# Patient Record
Sex: Female | Born: 1950 | Race: White | Hispanic: No | Smoking: Never smoker
Health system: Southern US, Community
[De-identification: ages and names within clinical notes are randomized; demographics above are authoritative.]

## PROBLEM LIST (undated history)

## (undated) DIAGNOSIS — E079 Disorder of thyroid, unspecified: Secondary | ICD-10-CM

## (undated) DIAGNOSIS — E785 Hyperlipidemia, unspecified: Secondary | ICD-10-CM

## (undated) DIAGNOSIS — I10 Essential (primary) hypertension: Secondary | ICD-10-CM

## (undated) HISTORY — PX: ABDOMINAL HYSTERECTOMY: SHX81

---

## 2019-07-08 ENCOUNTER — Emergency Department: Payer: Medicare Other

## 2019-07-08 ENCOUNTER — Encounter: Payer: Self-pay | Admitting: Emergency Medicine

## 2019-07-08 ENCOUNTER — Emergency Department
Admission: EM | Admit: 2019-07-08 | Discharge: 2019-07-08 | Disposition: A | Discharge status: Home / Self Care | Payer: Medicare Other | Attending: Emergency Medicine | Admitting: Emergency Medicine

## 2019-07-08 DIAGNOSIS — K5900 Constipation, unspecified: Secondary | ICD-10-CM | POA: Insufficient documentation

## 2019-07-08 DIAGNOSIS — R109 Unspecified abdominal pain: Secondary | ICD-10-CM | POA: Diagnosis present

## 2019-07-08 DIAGNOSIS — R1084 Generalized abdominal pain: Secondary | ICD-10-CM

## 2019-07-08 DIAGNOSIS — R55 Syncope and collapse: Secondary | ICD-10-CM | POA: Diagnosis not present

## 2019-07-08 DIAGNOSIS — I10 Essential (primary) hypertension: Secondary | ICD-10-CM | POA: Diagnosis not present

## 2019-07-08 DIAGNOSIS — R42 Dizziness and giddiness: Secondary | ICD-10-CM | POA: Insufficient documentation

## 2019-07-08 HISTORY — DX: Essential (primary) hypertension: I10

## 2019-07-08 HISTORY — DX: Disorder of thyroid, unspecified: E07.9

## 2019-07-08 HISTORY — DX: Hyperlipidemia, unspecified: E78.5

## 2019-07-08 LAB — CBC WITH DIFFERENTIAL/PLATELET
Abs Immature Granulocytes: 0.02 10*3/uL (ref 0.00–0.07)
Basophils Absolute: 0 10*3/uL (ref 0.0–0.1)
Basophils Relative: 1 %
Eosinophils Absolute: 0.1 10*3/uL (ref 0.0–0.5)
Eosinophils Relative: 2 %
HCT: 38.4 % (ref 36.0–46.0)
Hemoglobin: 12.4 g/dL (ref 12.0–15.0)
Immature Granulocytes: 0 %
Lymphocytes Relative: 19 %
Lymphs Abs: 1.1 10*3/uL (ref 0.7–4.0)
MCH: 28.9 pg (ref 26.0–34.0)
MCHC: 32.3 g/dL (ref 30.0–36.0)
MCV: 89.5 fL (ref 80.0–100.0)
Monocytes Absolute: 0.5 10*3/uL (ref 0.1–1.0)
Monocytes Relative: 8 %
Neutro Abs: 4 10*3/uL (ref 1.7–7.7)
Neutrophils Relative %: 70 %
Platelets: 173 10*3/uL (ref 150–400)
RBC: 4.29 MIL/uL (ref 3.87–5.11)
RDW: 14.2 % (ref 11.5–15.5)
WBC: 5.7 10*3/uL (ref 4.0–10.5)
nRBC: 0 % (ref 0.0–0.2)

## 2019-07-08 LAB — COMPREHENSIVE METABOLIC PANEL
ALT: 18 U/L (ref 0–44)
AST: 27 U/L (ref 15–41)
Albumin: 3.6 g/dL (ref 3.5–5.0)
Alkaline Phosphatase: 45 U/L (ref 38–126)
Anion gap: 8 (ref 5–15)
BUN: 27 mg/dL — ABNORMAL HIGH (ref 8–23)
CO2: 26 mmol/L (ref 22–32)
Calcium: 8.8 mg/dL — ABNORMAL LOW (ref 8.9–10.3)
Chloride: 106 mmol/L (ref 98–111)
Creatinine, Ser: 1.21 mg/dL — ABNORMAL HIGH (ref 0.44–1.00)
GFR calc Af Amer: 52 mL/min — ABNORMAL LOW (ref >60)
GFR calc non Af Amer: 45 mL/min — ABNORMAL LOW (ref >60)
Glucose, Bld: 104 mg/dL — ABNORMAL HIGH (ref 70–99)
Potassium: 4.9 mmol/L (ref 3.5–5.1)
Sodium: 140 mmol/L (ref 135–145)
Total Bilirubin: 0.5 mg/dL (ref 0.3–1.2)
Total Protein: 6.4 g/dL — ABNORMAL LOW (ref 6.5–8.1)

## 2019-07-08 LAB — URINALYSIS, COMPLETE (UACMP) WITH MICROSCOPIC
Bacteria, UA: NONE SEEN
Bilirubin Urine: NEGATIVE
Glucose, UA: NEGATIVE mg/dL
Hgb urine dipstick: NEGATIVE
Ketones, ur: 5 mg/dL — AB
Nitrite: NEGATIVE
Protein, ur: 30 mg/dL — AB
Specific Gravity, Urine: 1.018 (ref 1.005–1.030)
pH: 6 (ref 5.0–8.0)

## 2019-07-08 LAB — LIPASE, BLOOD: Lipase: 70 U/L — ABNORMAL HIGH (ref 11–51)

## 2019-07-08 LAB — TROPONIN I (HIGH SENSITIVITY)
Troponin I (High Sensitivity): 3 ng/L (ref <18)
Troponin I (High Sensitivity): 3 ng/L (ref <18)

## 2019-07-08 MED ORDER — IOHEXOL 300 MG/ML  SOLN
75.0000 mL | Freq: Once | INTRAMUSCULAR | Status: AC | PRN
  Administered 2019-07-08: 13:00:00 75 mL via INTRAVENOUS

## 2019-07-08 MED ORDER — DOCUSATE SODIUM 100 MG PO CAPS: 100.0000 mg | ORAL_CAPSULE | Freq: Two times a day (BID) | ORAL | 2 refills | Status: AC

## 2019-07-08 MED ORDER — LACTATED RINGERS IV BOLUS
1000.0000 mL | Freq: Once | INTRAVENOUS | Status: AC
  Administered 2019-07-08: 1000 mL via INTRAVENOUS

## 2019-07-08 NOTE — ED Notes (Signed)
Patient transported to CT 

## 2019-07-08 NOTE — ED Notes (Signed)
Pt unhooked to go to restroom 

## 2019-07-08 NOTE — ED Notes (Signed)
Pt reported that she was trying to have a BM when she got dizzy today. Pt was attempting to do the same now and felt the same symptoms again. Dizziness, pale, stomach pain, and nausea. Pt assisted back into bed and given nausea bag. MD notified.

## 2019-07-08 NOTE — ED Triage Notes (Signed)
Pt to ED by EMS after a near syncopal episode at home. Pt states prior to EMS arrival she felt dizzy and had abdominal pain with 1 emesis occurrence. Upon EMS arrival pt's BP 68/46.

## 2019-07-08 NOTE — ED Provider Notes (Signed)
[facility] Emergency Department Provider Note   ____________________________________________   First MD Initiated Contact with Patient 07/08/19 1004     (approximate)  I have reviewed the triage vital signs and the nursing notes.   HISTORY  Chief Complaint Lightheadedness   HPI Desiree Elliott is a 68 y.o. female with past medical history of hypertension and hypothyroidism presents to the ED complaining of abdominal pain and lightheadedness.  Patient reports that as she was getting out of the shower earlier today she had sudden onset of diffuse abdominal pain associated with lightheadedness.  She was able to move herself into a chair, but continued to feel lightheaded and like she might pass out.  EMS was called and by the time they had arrived her abdominal pain had improved.  She states she vomited once, but she denies any diarrhea or constipation.  She has recently been feeling well with no fevers, cough, chest pain, or shortness of breath.  Initial blood pressure was noted to be low by EMS, she was given a fluid bolus with improvement.        Past Medical History:  Diagnosis Date  . Hyperlipidemia   . Hypertension   . Thyroid disease     There are no active problems to display for this patient.   Past Surgical History:  Procedure Laterality Date  . ABDOMINAL HYSTERECTOMY      Prior to Admission medications   Medication Sig Start Date End Date Taking? Authorizing Provider  docusate sodium (COLACE) 100 MG capsule Take 1 capsule (100 mg total) by mouth 2 (two) times daily. 07/08/19 07/07/20  Chesley Noon, MD    Allergies Patient has no known allergies.  History reviewed. No pertinent family history.  Social History Social History   Tobacco Use  . Smoking status: Never Smoker  . Smokeless tobacco: Never Used  Substance Use Topics  . Alcohol use: Not Currently  . Drug use: Never    Review of Systems  Constitutional: No  fever/chills Eyes: No visual changes. ENT: No sore throat. Cardiovascular: Denies chest pain.  Positive for lightheadedness and near syncope. Respiratory: Denies shortness of breath. Gastrointestinal: Positive for abdominal pain.  No nausea, no vomiting.  No diarrhea.  No constipation. Genitourinary: Negative for dysuria. Musculoskeletal: Negative for back pain. Skin: Negative for rash. Neurological: Negative for headaches, focal weakness or numbness.  ____________________________________________   PHYSICAL EXAM:  VITAL SIGNS: ED Triage Vitals  Enc Vitals Group     BP      Pulse      Resp      Temp      Temp src      SpO2      Weight      Height      Head Circumference      Peak Flow      Pain Score      Pain Loc      Pain Edu?      Excl. in GC?     Constitutional: Alert and oriented. Eyes: Conjunctivae are normal. Head: Atraumatic. Nose: No congestion/rhinnorhea. Mouth/Throat: Mucous membranes are moist. Neck: Normal ROM Cardiovascular: Normal rate, regular rhythm. Grossly normal heart sounds. Respiratory: Normal respiratory effort.  No retractions. Lungs CTAB. Gastrointestinal: Soft and nontender. No distention. Genitourinary: deferred Musculoskeletal: No lower extremity tenderness nor edema. Neurologic:  Normal speech and language. No gross focal neurologic deficits are appreciated. Skin:  Skin is warm, dry and intact. No rash noted. Psychiatric: Mood and affect are  normal. Speech and behavior are normal.  ____________________________________________   LABS (all labs ordered are listed, but only abnormal results are displayed)  Labs Reviewed  URINALYSIS, COMPLETE (UACMP) WITH MICROSCOPIC - Abnormal; Notable for the following components:      Result Value   Color, Urine YELLOW (*)    APPearance CLEAR (*)    Ketones, ur 5 (*)    Protein, ur 30 (*)    Leukocytes,Ua SMALL (*)    Non Squamous Epithelial PRESENT (*)    All other components within normal  limits  COMPREHENSIVE METABOLIC PANEL - Abnormal; Notable for the following components:   Glucose, Bld 104 (*)    BUN 27 (*)    Creatinine, Ser 1.21 (*)    Calcium 8.8 (*)    Total Protein 6.4 (*)    GFR calc non Af Amer 45 (*)    GFR calc Af Amer 52 (*)    All other components within normal limits  LIPASE, BLOOD - Abnormal; Notable for the following components:   Lipase 70 (*)    All other components within normal limits  CBC WITH DIFFERENTIAL/PLATELET  TROPONIN I (HIGH SENSITIVITY)  TROPONIN I (HIGH SENSITIVITY)   ____________________________________________  EKG  ED ECG REPORT I, Blake Divine, the attending physician, personally viewed and interpreted this ECG.   Date: 07/08/2019  EKG Time: 10:10  Rate: 57  Rhythm: normal sinus rhythm  Axis: Normal  Intervals:right bundle branch block  ST&T Change: None   PROCEDURES  Procedure(s) performed (including Critical Care):  Procedures   ____________________________________________   INITIAL IMPRESSION / ASSESSMENT AND PLAN / ED COURSE       68 year old female with history of hypertension and hypothyroidism presents to the ED following episode of acute onset abdominal pain associated with lightheadedness and near syncope.  Abdominal pain has since resolved, but patient continues to report some mild lightheadedness.  Initial EKG from EMS reviewed and shows no evidence of arrhythmia or ischemia, will recheck here.  She currently has a benign and nonfocal abdominal exam, however given acute onset of pain with lightheadedness and hypotension, will check CT abdomen pelvis.  Low suspicion for infectious etiology given acute onset and lack of fever, will screen UA and chest x-ray.  No apparent infectious process, chest x-ray and UA negative.  CT abdomen/pelvis is negative for acute process, shows only constipation and evidence of chronic kidney disease.  Suspect creatinine of 1.21 is near patient's baseline and she feels much  better after IV fluid bolus.  Do not feel lipase of 70 is clinically significant given patient has no upper abdominal pain or tenderness.  2 sets of troponin are unremarkable.  Patient now admits that her first episode of near syncope occurred after straining for a bowel movement, had a similar episode here in the ED.  Given this, suspect vasovagal etiology of patient's near syncope.  Will have her follow-up with PCP and return to the ED for new or worsening symptoms.  We will treat constipation with stool softener, fiber supplement, and MiraLAX.  Patient agrees with plan.      ____________________________________________   FINAL CLINICAL IMPRESSION(S) / ED DIAGNOSES  Final diagnoses:  Near syncope  Generalized abdominal pain  Constipation, unspecified constipation type     ED Discharge Orders         Ordered    docusate sodium (COLACE) 100 MG capsule  2 times daily     07/08/19 1434  Note:  This document was prepared using Dragon voice recognition software and may include unintentional dictation errors.   Chesley NoonJessup, Ranika Mcniel, MD 07/08/19 [phone]

## 2019-07-08 NOTE — Discharge Instructions (Signed)
Please follow-up with your primary care doctor for further evaluation, including other test to look at your heart.  If you have recurrent episodes, please return to the ER for further evaluation.  In the meantime, you may take fiber supplements, Colace, and MiraLAX for your constipation.  Start by taking 1 capful of MiraLAX twice a day, which you may increase by 1 capful per day up until 4 capfuls twice a day.

## 2019-07-08 NOTE — ED Notes (Signed)
Pt wheeled out to lobby by Lou-ann.

## 2020-05-12 ENCOUNTER — Emergency Department
Admission: EM | Admit: 2020-05-12 | Discharge: 2020-05-12 | Disposition: A | Discharge status: Home / Self Care | Payer: Medicare Other | Attending: Emergency Medicine | Admitting: Emergency Medicine

## 2020-05-12 ENCOUNTER — Other Ambulatory Visit: Payer: Self-pay

## 2020-05-12 ENCOUNTER — Encounter: Payer: Self-pay | Admitting: Emergency Medicine

## 2020-05-12 DIAGNOSIS — K5900 Constipation, unspecified: Secondary | ICD-10-CM | POA: Diagnosis not present

## 2020-05-12 DIAGNOSIS — I10 Essential (primary) hypertension: Secondary | ICD-10-CM | POA: Insufficient documentation

## 2020-05-12 DIAGNOSIS — R55 Syncope and collapse: Secondary | ICD-10-CM | POA: Diagnosis not present

## 2020-05-12 DIAGNOSIS — R1030 Lower abdominal pain, unspecified: Secondary | ICD-10-CM | POA: Insufficient documentation

## 2020-05-12 DIAGNOSIS — E860 Dehydration: Secondary | ICD-10-CM

## 2020-05-12 DIAGNOSIS — R42 Dizziness and giddiness: Secondary | ICD-10-CM | POA: Diagnosis present

## 2020-05-12 LAB — URINALYSIS, COMPLETE (UACMP) WITH MICROSCOPIC
Bilirubin Urine: NEGATIVE
Glucose, UA: NEGATIVE mg/dL
Hgb urine dipstick: NEGATIVE
Ketones, ur: NEGATIVE mg/dL
Nitrite: NEGATIVE
Protein, ur: NEGATIVE mg/dL
Specific Gravity, Urine: 1.023 (ref 1.005–1.030)
pH: 6 (ref 5.0–8.0)

## 2020-05-12 LAB — CBC
HCT: 40.9 % (ref 36.0–46.0)
Hemoglobin: 13.4 g/dL (ref 12.0–15.0)
MCH: 30.3 pg (ref 26.0–34.0)
MCHC: 32.8 g/dL (ref 30.0–36.0)
MCV: 92.5 fL (ref 80.0–100.0)
Platelets: 229 10*3/uL (ref 150–400)
RBC: 4.42 MIL/uL (ref 3.87–5.11)
RDW: 13.5 % (ref 11.5–15.5)
WBC: 8.5 10*3/uL (ref 4.0–10.5)
nRBC: 0 % (ref 0.0–0.2)

## 2020-05-12 LAB — BASIC METABOLIC PANEL
Anion gap: 5 (ref 5–15)
BUN: 29 mg/dL — ABNORMAL HIGH (ref 8–23)
CO2: 29 mmol/L (ref 22–32)
Calcium: 9.1 mg/dL (ref 8.9–10.3)
Chloride: 99 mmol/L (ref 98–111)
Creatinine, Ser: 1.34 mg/dL — ABNORMAL HIGH (ref 0.44–1.00)
GFR, Estimated: 40 mL/min — ABNORMAL LOW (ref >60)
Glucose, Bld: 155 mg/dL — ABNORMAL HIGH (ref 70–99)
Potassium: 4.3 mmol/L (ref 3.5–5.1)
Sodium: 133 mmol/L — ABNORMAL LOW (ref 135–145)

## 2020-05-12 LAB — GLUCOSE, CAPILLARY: Glucose-Capillary: 152 mg/dL — ABNORMAL HIGH (ref 70–99)

## 2020-05-12 MED ORDER — SODIUM CHLORIDE 0.9 % IV BOLUS
1000.0000 mL | Freq: Once | INTRAVENOUS | Status: AC
  Administered 2020-05-12: 1000 mL via INTRAVENOUS

## 2020-05-12 NOTE — ED Triage Notes (Signed)
Pt to ED via POV stating that this morning she got up and went to the bathroom, pt states that she got very dizzy and broke out in a cold sweat. Pt states that this has happened before and she was told that she was dehydrated. Pt is currently A & O in NAD.

## 2020-05-12 NOTE — ED Provider Notes (Signed)
[facility] Emergency Department Provider Note   ____________________________________________   First MD Initiated Contact with Patient 05/12/20 [phone]     (approximate)  I have reviewed the triage vital signs and the nursing notes.   HISTORY  Chief Complaint Dizziness    HPI Desiree Elliott is a 69 y.o. female here for evaluation after feeling like she was about to pass out on the toilet this morning  Patient reports a history of constipation, reports she has been seeing her doctor for this and will frequently go through episodes where she will have constipation no cause crampy abdominal discomfort.  This morning she got up she was using the bathroom, she has had a couple very small hard bowel movements over the last 3 days.  As she was using the bathroom she started feel lightheaded, fatigued, and so she was going to pass out.  She lowered her self to the ground and put her legs up when she was told to do in the past when she had a similar episode around December  She felt better within 10 to 15 minutes.  She did not pass out.  No chest pain no fevers or chills.  Reports intermittent crampy lower abdominal discomfort consistent with how she feels when she has "constipation"  She feels improved now.  Does not feel lightheaded she has been up and walking in her room without difficulty.  She reports that when this happens sometimes she gets little dehydrated.   No history of bowel surgeries.  No history of obstructions.  Past Medical History:  Diagnosis Date  . Hyperlipidemia   . Hypertension   . Thyroid disease     There are no problems to display for this patient.   Past Surgical History:  Procedure Laterality Date  . ABDOMINAL HYSTERECTOMY      Prior to Admission medications   Medication Sig Start Date End Date Taking? Authorizing Provider  docusate sodium (COLACE) 100 MG capsule Take 1 capsule (100 mg total) by mouth 2 (two) times daily.  07/08/19 07/07/20  Chesley Noon, MD    Allergies Patient has no known allergies.  No family history on file.  Social History Social History   Tobacco Use  . Smoking status: Never Smoker  . Smokeless tobacco: Never Used  Substance Use Topics  . Alcohol use: Not Currently  . Drug use: Never    Review of Systems Constitutional: No fever/chills Eyes: No visual changes. ENT: No sore throat. Cardiovascular: Denies chest pain. Respiratory: Denies shortness of breath. Gastrointestinal: No abdominal pain except intermittent crampy discomfort and a constipated feeling.  No nausea or vomiting.  Denies any severe pain pain is located mostly along lower left lower abdomen and goes away usually relieved with small bowel movement.  Passing gas normally Genitourinary: Negative for dysuria.  No abnormal odor.  No difficulty with urination. Musculoskeletal: Negative for back pain. Skin: Negative for rash. Neurological: Negative for headaches weakness or numbness    ____________________________________________   PHYSICAL EXAM:  VITAL SIGNS: ED Triage Vitals  Enc Vitals Group     BP 05/12/20 0715 102/66     Pulse Rate 05/12/20 0715 86     Resp 05/12/20 0715 18     Temp 05/12/20 0715 97.6 F (36.4 C)     Temp Source 05/12/20 0715 Oral     SpO2 05/12/20 0715 98 %     Weight 05/12/20 0709 165 lb (74.8 kg)     Height 05/12/20 0709 5\' 1"  (1.549  m)     Head Circumference --      Peak Flow --      Pain Score 05/12/20 0709 0     Pain Loc --      Pain Edu? --      Excl. in GC? --     Constitutional: Alert and oriented. Well appearing and in no acute distress.  Ambulatory in room without distress.  Very pleasant. Eyes: Conjunctivae are normal. Head: Atraumatic. Nose: No congestion/rhinnorhea. Mouth/Throat: Mucous membranes are slightly dry. Neck: No stridor.  Cardiovascular: Normal rate, regular rhythm. Grossly normal heart sounds.  Good peripheral circulation. Respiratory: Normal  respiratory effort.  No retractions. Lungs CTAB. Gastrointestinal: Soft and nontender except she does report some mild discomfort to palpation of the left lower quadrant but there is no rebound or guarding. No distention.  Normal bowel sounds. Musculoskeletal: No lower extremity tenderness nor edema. Neurologic:  Normal speech and language. No gross focal neurologic deficits are appreciated.  Skin:  Skin is warm, dry and intact. No rash noted. Psychiatric: Mood and affect are normal. Speech and behavior are normal.  ____________________________________________   LABS (all labs ordered are listed, but only abnormal results are displayed)  Labs Reviewed  BASIC METABOLIC PANEL - Abnormal; Notable for the following components:      Result Value   Sodium 133 (*)    Glucose, Bld 155 (*)    BUN 29 (*)    Creatinine, Ser 1.34 (*)    GFR, Estimated 40 (*)    All other components within normal limits  GLUCOSE, CAPILLARY - Abnormal; Notable for the following components:   Glucose-Capillary 152 (*)    All other components within normal limits  URINALYSIS, COMPLETE (UACMP) WITH MICROSCOPIC - Abnormal; Notable for the following components:   Color, Urine YELLOW (*)    APPearance HAZY (*)    Leukocytes,Ua MODERATE (*)    Bacteria, UA RARE (*)    All other components within normal limits  URINE CULTURE  CBC  CBG MONITORING, ED   ____________________________________________  EKG  Reviewed entered by me at 7:10 AM Heart rate 99 QRS 120 QTc 450 Normal sinus rhythm, right bundle branch block.  No noted ischemic abnormalities.  Some baseline wander  When compared with previous EKG from July 08, 2019, appears similar in appearance ____________________________________________  RADIOLOGY  Do not see indication for acute imaging.  Very reassuring abdominal exam.  No evidence of peritonitis.  Denies fevers.  She reports a history of same symptoms happening multiple times frequently when she  feels constipated including crampy abdominal discomfort. ____________________________________________   PROCEDURES  Procedure(s) performed: None  Procedures  Critical Care performed: No  ____________________________________________   INITIAL IMPRESSION / ASSESSMENT AND PLAN / ED COURSE  Pertinent labs & imaging results that were available during my care of the patient were reviewed by me and considered in my medical decision making (see chart for details).   Patient presents having had a near syncopal episode while in the toilet this morning.  Associated with feeling of constipation which she struggles with and sees her doctor regularly for.  I suspect based on her description and likely had a vasovagal episode, possibly precipitated by some dehydration related to feeling constipated.  Crampy lower abdominal discomfort and reassuring examination as well as her history seem to suggest that constipation is likely cause of her crampy pain.  I do not see evidence of diverticulitis or acute abdomen.  Symptoms do not seem consistent with a vascular  primary cardiac or pulmonary etiology.  Denies any urinary symptoms.  On previous visit, patient received hydration, felt improved.  She certainly has a reassuring exam at this time.  We will plan to hydrate and reassess.    Urinalysis reviewed, patient denies any pain or burning with urination.  Sent for culture, would only treat for UTI if culture positive  Return precautions and treatment recommendations and follow-up discussed with the patient who is agreeable with the plan.   ____________________________________________   FINAL CLINICAL IMPRESSION(S) / ED DIAGNOSES  Final diagnoses:  Dehydration, mild  Constipation, unspecified constipation type  Vasovagal syncope        Note:  This document was prepared using Dragon voice recognition software and may include unintentional dictation errors       Sharyn Creamer, MD 05/12/20  1112

## 2020-05-13 LAB — URINE CULTURE: Culture: 80000 — AB

## 2020-05-14 NOTE — Progress Notes (Signed)
ED Antimicrobial Stewardship Positive Culture Follow Up   Hanh Kertesz is an 69 y.o. female who presented to [facility] on 05/12/2020 with a chief complaint of dizziness.  Chief Complaint  Patient presents with  . Dizziness    Recent Results (from the past 720 hour(s))  Urine Culture     Status: Abnormal   Collection Time: 05/12/20  9:49 AM   Specimen: Urine, Random  Result Value Ref Range Status   Specimen Description   Final    URINE, RANDOM Performed at [facility], [address] [place], [place] [address]    Special Requests   Final    NONE Performed at [facility], [address] [place], [place] [address]    Culture (A)  Final    80,000 COLONIES/mL GROUP B STREP(S.AGALACTIAE)ISOLATED TESTING AGAINST S. AGALACTIAE NOT ROUTINELY PERFORMED DUE TO PREDICTABILITY OF AMP/PEN/VAN SUSCEPTIBILITY. Performed at [facility], [address] [place], [place] [address]    Report Status 05/13/2020 FINAL  Final   Patient asymptomatic and has appointment with PCP 10/12. Per discussion with ED provider, no antibiotics recommended at this time.   Raiford Noble, PharmD Pharmacy Resident  05/14/2020 2:15 PM

## 2020-10-15 ENCOUNTER — Other Ambulatory Visit (HOSPITAL_COMMUNITY): Payer: Self-pay | Admitting: Internal Medicine

## 2020-10-15 ENCOUNTER — Other Ambulatory Visit: Payer: Self-pay | Admitting: Internal Medicine

## 2020-10-15 DIAGNOSIS — R200 Anesthesia of skin: Secondary | ICD-10-CM

## 2020-10-16 ENCOUNTER — Other Ambulatory Visit: Payer: Self-pay

## 2020-10-16 ENCOUNTER — Other Ambulatory Visit: Payer: Self-pay | Admitting: Internal Medicine

## 2020-10-16 ENCOUNTER — Ambulatory Visit
Admission: RE | Admit: 2020-10-16 | Discharge: 2020-10-16 | Disposition: A | Discharge status: Home / Self Care | Payer: Medicare Other | Source: Ambulatory Visit | Attending: Internal Medicine | Admitting: Internal Medicine

## 2020-10-16 DIAGNOSIS — R200 Anesthesia of skin: Secondary | ICD-10-CM | POA: Diagnosis not present

## 2020-10-16 DIAGNOSIS — G939 Disorder of brain, unspecified: Secondary | ICD-10-CM

## 2020-10-16 DIAGNOSIS — M5412 Radiculopathy, cervical region: Secondary | ICD-10-CM

## 2020-10-18 ENCOUNTER — Ambulatory Visit
Admission: RE | Admit: 2020-10-18 | Discharge: 2020-10-18 | Disposition: A | Discharge status: Home / Self Care | Payer: Medicare Other | Source: Ambulatory Visit | Attending: Internal Medicine | Admitting: Internal Medicine

## 2020-10-18 ENCOUNTER — Other Ambulatory Visit: Payer: Self-pay

## 2020-10-18 DIAGNOSIS — G939 Disorder of brain, unspecified: Secondary | ICD-10-CM

## 2020-10-18 DIAGNOSIS — M5412 Radiculopathy, cervical region: Secondary | ICD-10-CM | POA: Insufficient documentation

## 2020-10-18 MED ORDER — GADOBUTROL 1 MMOL/ML IV SOLN
7.0000 mL | Freq: Once | INTRAVENOUS | Status: AC | PRN
  Administered 2020-10-18: 7 mL via INTRAVENOUS

## 2020-11-09 ENCOUNTER — Other Ambulatory Visit: Payer: Self-pay | Admitting: Neurosurgery

## 2020-11-09 DIAGNOSIS — H93A3 Pulsatile tinnitus, bilateral: Secondary | ICD-10-CM

## 2020-11-09 DIAGNOSIS — G939 Disorder of brain, unspecified: Secondary | ICD-10-CM

## 2020-11-13 ENCOUNTER — Ambulatory Visit
Admission: RE | Admit: 2020-11-13 | Discharge: 2020-11-13 | Disposition: A | Discharge status: Home / Self Care | Payer: Medicare Other | Source: Ambulatory Visit | Attending: Neurosurgery | Admitting: Neurosurgery

## 2020-11-13 ENCOUNTER — Other Ambulatory Visit: Payer: Self-pay

## 2020-11-13 DIAGNOSIS — G939 Disorder of brain, unspecified: Secondary | ICD-10-CM | POA: Diagnosis present

## 2020-11-13 DIAGNOSIS — H93A3 Pulsatile tinnitus, bilateral: Secondary | ICD-10-CM | POA: Insufficient documentation

## 2020-11-13 MED ORDER — IOHEXOL 350 MG/ML SOLN
75.0000 mL | Freq: Once | INTRAVENOUS | Status: AC | PRN
  Administered 2020-11-13: 75 mL via INTRAVENOUS

## 2021-02-13 ENCOUNTER — Other Ambulatory Visit: Payer: Self-pay | Admitting: Internal Medicine

## 2021-02-13 DIAGNOSIS — Z1231 Encounter for screening mammogram for malignant neoplasm of breast: Secondary | ICD-10-CM

## 2021-02-22 ENCOUNTER — Ambulatory Visit
Admission: RE | Admit: 2021-02-22 | Discharge: 2021-02-22 | Disposition: A | Discharge status: Home / Self Care | Payer: Medicare Other | Source: Ambulatory Visit | Attending: Internal Medicine | Admitting: Internal Medicine

## 2021-02-22 ENCOUNTER — Other Ambulatory Visit: Payer: Self-pay

## 2021-02-22 DIAGNOSIS — Z1231 Encounter for screening mammogram for malignant neoplasm of breast: Secondary | ICD-10-CM | POA: Diagnosis present

## 2021-08-24 IMAGING — CT CT ANGIO NECK
1 of 4 series · 6 of 14 positions shown · IV contrast (omnipaque)
Comparison: Brain MRI 10/18/2020. Cervical spine MRI 10/18/2020.
Brain MRI 10/16/2020.

CLINICAL DATA: Pulsatile tinnitus of both ears. Temporal lobe
lesion. Additional history provided by scanning technologist:
Patient reports hearing "pulsing" in left ear for 2-3 years.

EXAM:
CT ANGIOGRAPHY HEAD AND NECK
TECHNIQUE: Multidetector CT imaging of the head and neck was performed using
the standard protocol during bolus administration of intravenous
contrast. Multiplanar CT image reconstructions and MIPs were
obtained to evaluate the vascular anatomy. Carotid stenosis
measurements (when applicable) are obtained utilizing NASCET
criteria, using the distal internal carotid diameter as the
denominator.
CONTRAST:  75mL OMNIPAQUE IOHEXOL 350 MG/ML SOLN

[Series 10: ax thin · axial · 0.39mm/px · z∈[-246,-42]mm · 6 of 286 slices shown]
[im 41/286  soft-tissue]
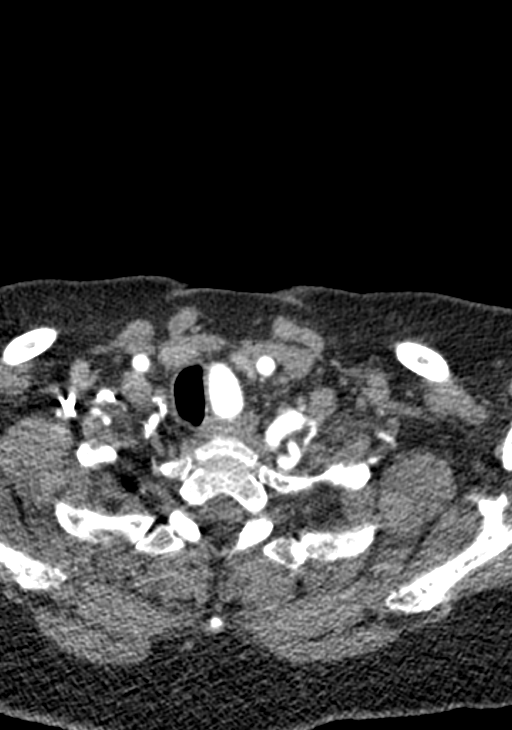
[im 82/286  bone]
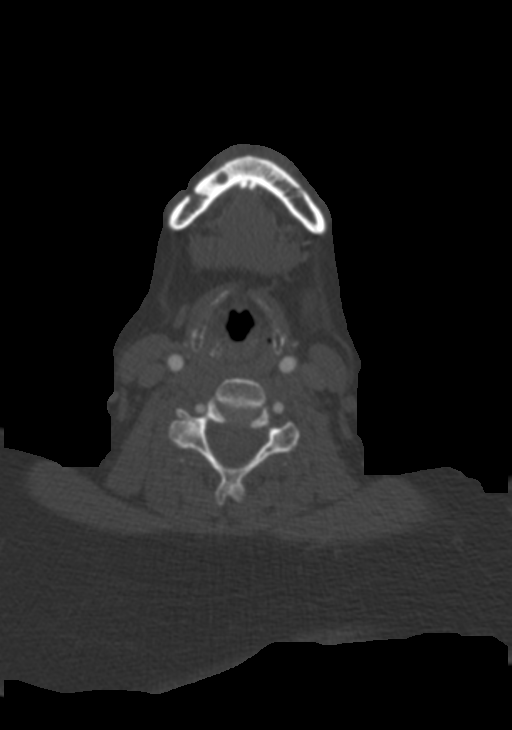
[im 123/286  soft-tissue]
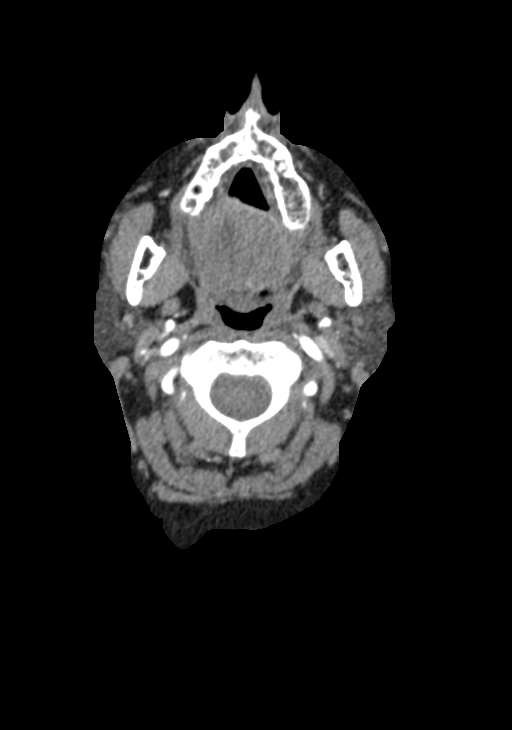
[im 163/286  bone]
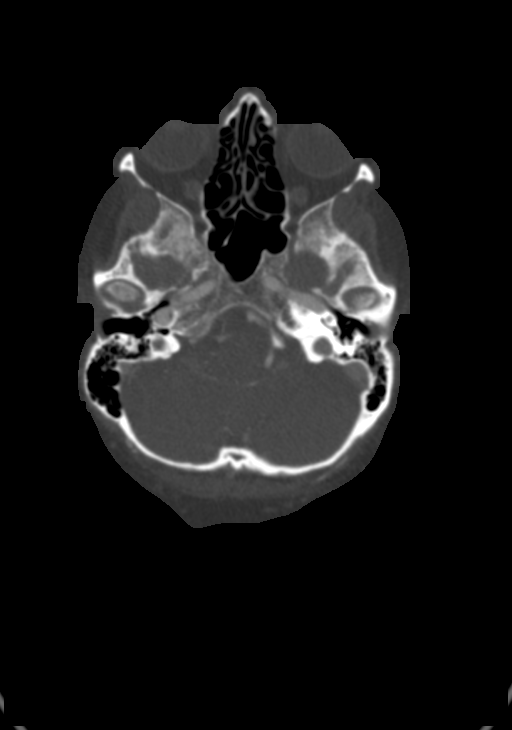
[im 204/286  soft-tissue]
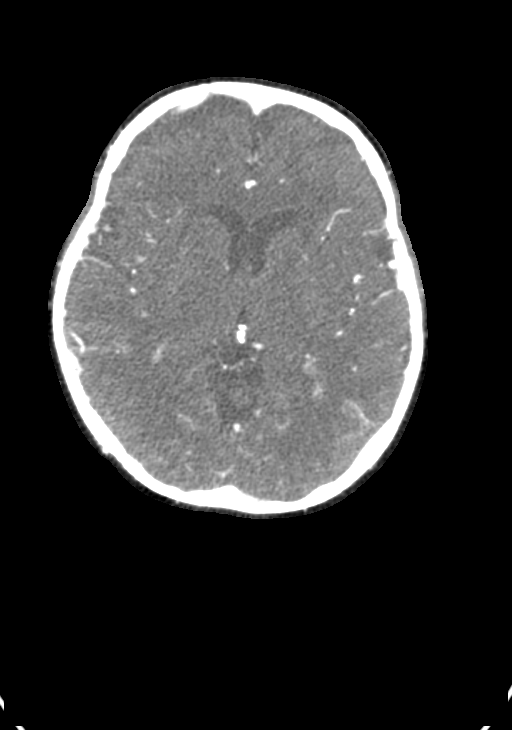
[im 245/286  bone]
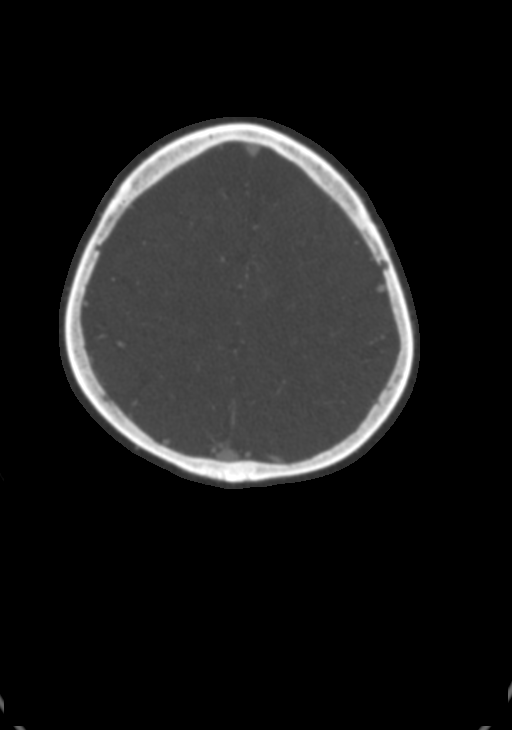

[6 of 14 positions shown; findings below may reference images not displayed]

FINDINGS: CT HEAD FINDINGS

Brain:

Cerebral volume is normal for age.

Unchanged size of a 1 cm cystic appearing lesion. There is mild
surrounding hypoattenuation compatible with the mild edema/gliosis
demonstrated on the prior brain MRI examination of 10/16/2020.

Mild patchy and ill-defined hypoattenuation elsewhere within the
cerebral white matter is nonspecific, but compatible with chronic
small vessel ischemic disease.

There is no acute intracranial hemorrhage.

No demarcated cortical infarct.

No extra-axial fluid collection.

No midline shift.

Partially empty sella turcica.

Vascular: No hyperdense vessel.

Skull: Normal. Negative for fracture or focal lesion.

Sinuses: No significant paranasal sinus disease.

Orbits: No mass or acute finding.

Review of the MIP images confirms the above findings

CTA NECK FINDINGS

Aortic arch: Common origin of the innominate and left common carotid
arteries. No hemodynamically significant innominate or proximal
subclavian artery stenosis.

Right carotid system: CCA and ICA patent within the neck without
stenosis. No significant atherosclerotic disease.

Left carotid system: CCA and ICA patent within the neck without
stenosis. No significant atherosclerotic disease.

Vertebral arteries: Codominant and patent within the neck without
stenosis.

Skeleton: Cervical spondylosis. Partially imaged thoracic
dextrocurvature. No acute bony abnormality or aggressive osseous
lesion.

Other neck: No neck mass or cervical lymphadenopathy. Subcentimeter
right thyroid lobe nodule not meeting consensus criteria for
ultrasound follow-up. Tortuous innominate artery and thoracic
dextrocurvature with resultant rightward displacement of the
subglottic trachea.

Upper chest: No consolidation within the imaged lung apices.

Review of the MIP images confirms the above findings

CTA HEAD FINDINGS

Anterior circulation:

The intracranial internal carotid arteries are patent and follow a
normal course. The M1 middle cerebral arteries are patent. No M2
proximal branch occlusion or high-grade proximal stenosis is
identified. The anterior cerebral arteries are patent. No
intracranial aneurysm is identified.

Posterior circulation:

The intracranial vertebral arteries are patent. The basilar artery
is patent. The posterior cerebral arteries are patent. The P1 right
PCA is hypoplastic and there is a sizable right posterior
communicating artery. The left posterior communicating artery is
hypoplastic or absent.

Venous sinuses: Within the limitations of contrast timing, no
convincing thrombus. Unremarkable appearance of the jugular bulbs
and sigmoid plates.

Anatomic variants: As described

Review of the MIP images confirms the above findings
IMPRESSION: CT head:

1. 1 cm cystic lesion within the anterior right temporal lobe with
mild surrounding edema/gliosis, unchanged from the brain MRI
examinations of 10/16/2020 and 10/18/2020. Please refer to these
prior reports for further description. A follow-up contrast-enhanced
brain MRI 6 months from the prior MRI of 10/18/2020 is again
recommended to ensure stability.
2. Stable mild cerebral white matter chronic small vessel ischemic
disease.

CTA neck:

1. The bilateral common carotid, internal carotid and vertebral
arteries are patent within the neck without stenosis or significant
atherosclerotic disease.
2. Rightward deviation of the subglottic trachea secondary to
innominate artery tortuosity and a thoracic dextrocurvature.

CTA head:

1. No intracranial large vessel occlusion or proximal high-grade
arterial stenosis.
2. Intracranial aneurysm is identified.
3. No cause for pulsatile tinnitus is identified.

## 2021-08-24 IMAGING — CT CT ANGIO HEAD
1 of 4 series · 6 of 14 positions shown · IV contrast (omnipaque)
Comparison: Brain MRI 10/18/2020. Cervical spine MRI 10/18/2020.
Brain MRI 10/16/2020.

CLINICAL DATA: Pulsatile tinnitus of both ears. Temporal lobe
lesion. Additional history provided by scanning technologist:
Patient reports hearing "pulsing" in left ear for 2-3 years.

EXAM:
CT ANGIOGRAPHY HEAD AND NECK
TECHNIQUE: Multidetector CT imaging of the head and neck was performed using
the standard protocol during bolus administration of intravenous
contrast. Multiplanar CT image reconstructions and MIPs were
obtained to evaluate the vascular anatomy. Carotid stenosis
measurements (when applicable) are obtained utilizing NASCET
criteria, using the distal internal carotid diameter as the
denominator.
CONTRAST:  75mL OMNIPAQUE IOHEXOL 350 MG/ML SOLN

[Series 10: ax thin · axial · 0.39mm/px · z∈[-246,-42]mm · 6 of 286 slices shown]
[im 41/286  soft-tissue]
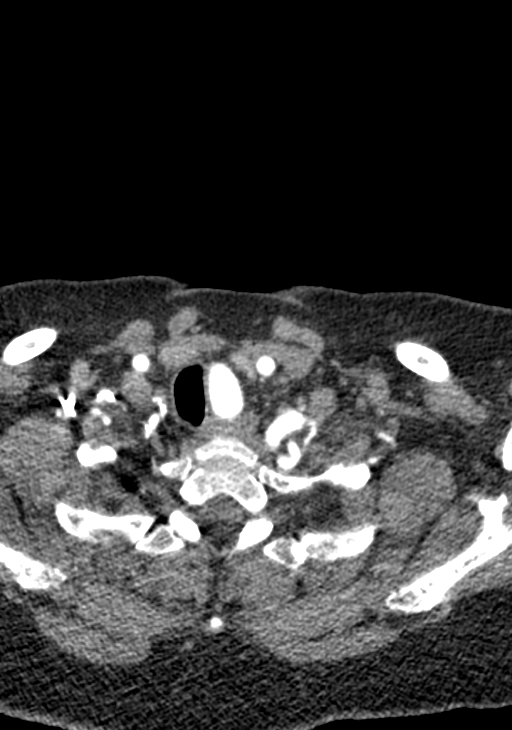
[im 82/286  bone]
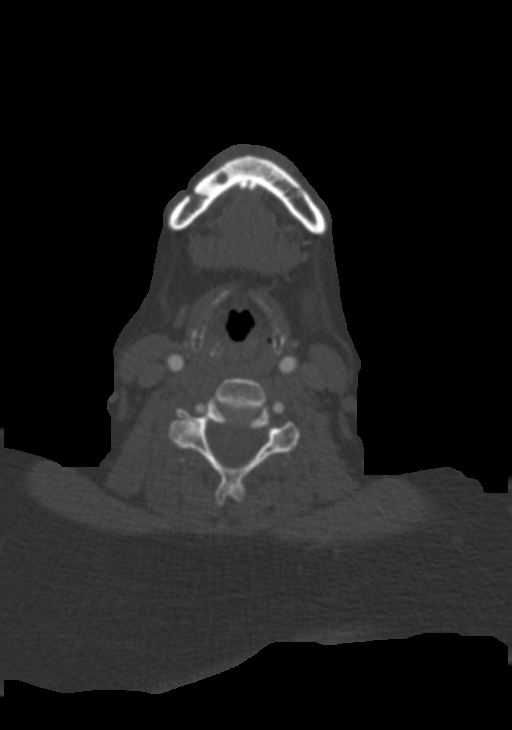
[im 123/286  soft-tissue]
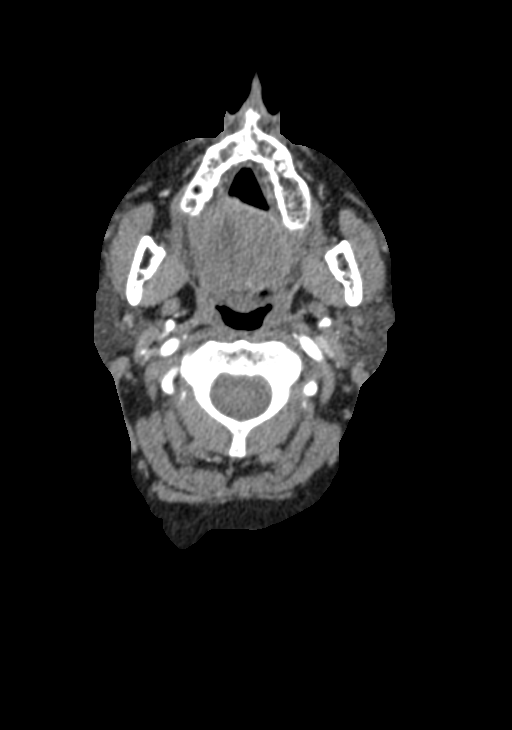
[im 163/286  bone]
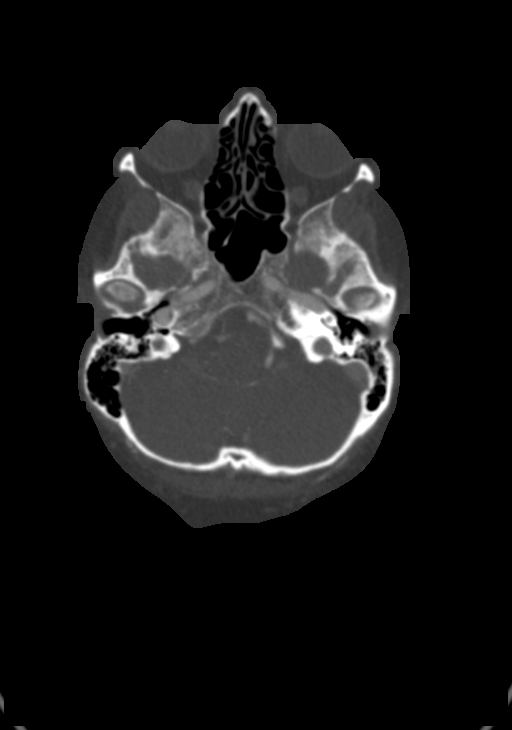
[im 204/286  soft-tissue]
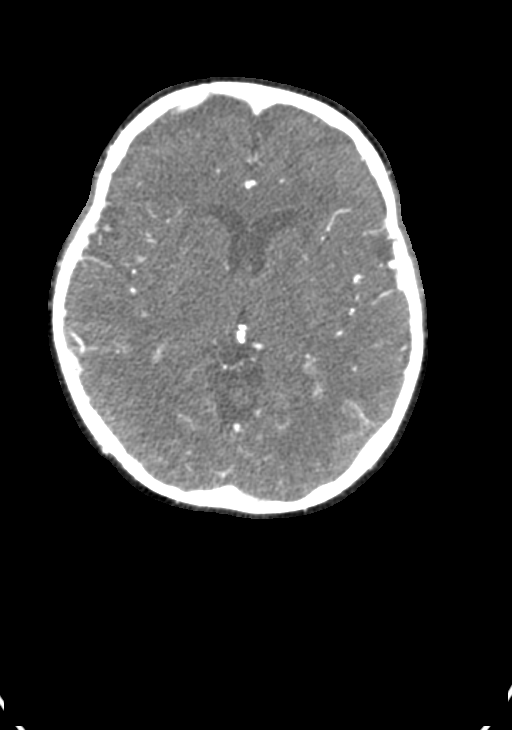
[im 245/286  bone]
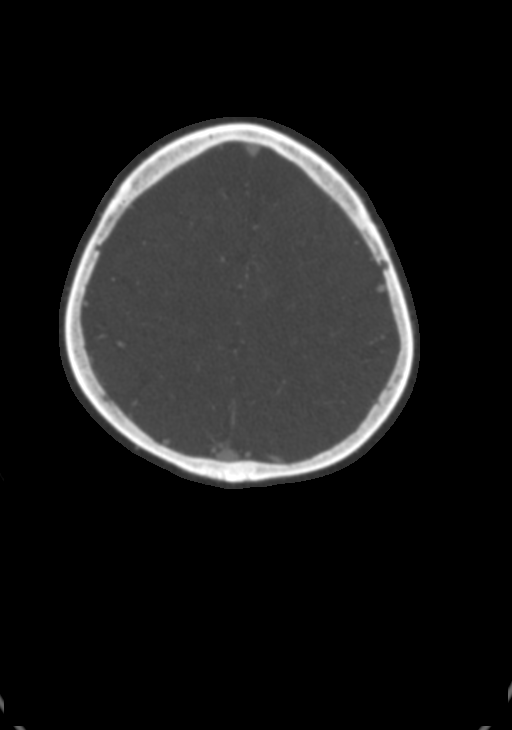

[6 of 14 positions shown; findings below may reference images not displayed]

FINDINGS: CT HEAD FINDINGS

Brain:

Cerebral volume is normal for age.

Unchanged size of a 1 cm cystic appearing lesion. There is mild
surrounding hypoattenuation compatible with the mild edema/gliosis
demonstrated on the prior brain MRI examination of 10/16/2020.

Mild patchy and ill-defined hypoattenuation elsewhere within the
cerebral white matter is nonspecific, but compatible with chronic
small vessel ischemic disease.

There is no acute intracranial hemorrhage.

No demarcated cortical infarct.

No extra-axial fluid collection.

No midline shift.

Partially empty sella turcica.

Vascular: No hyperdense vessel.

Skull: Normal. Negative for fracture or focal lesion.

Sinuses: No significant paranasal sinus disease.

Orbits: No mass or acute finding.

Review of the MIP images confirms the above findings

CTA NECK FINDINGS

Aortic arch: Common origin of the innominate and left common carotid
arteries. No hemodynamically significant innominate or proximal
subclavian artery stenosis.

Right carotid system: CCA and ICA patent within the neck without
stenosis. No significant atherosclerotic disease.

Left carotid system: CCA and ICA patent within the neck without
stenosis. No significant atherosclerotic disease.

Vertebral arteries: Codominant and patent within the neck without
stenosis.

Skeleton: Cervical spondylosis. Partially imaged thoracic
dextrocurvature. No acute bony abnormality or aggressive osseous
lesion.

Other neck: No neck mass or cervical lymphadenopathy. Subcentimeter
right thyroid lobe nodule not meeting consensus criteria for
ultrasound follow-up. Tortuous innominate artery and thoracic
dextrocurvature with resultant rightward displacement of the
subglottic trachea.

Upper chest: No consolidation within the imaged lung apices.

Review of the MIP images confirms the above findings

CTA HEAD FINDINGS

Anterior circulation:

The intracranial internal carotid arteries are patent and follow a
normal course. The M1 middle cerebral arteries are patent. No M2
proximal branch occlusion or high-grade proximal stenosis is
identified. The anterior cerebral arteries are patent. No
intracranial aneurysm is identified.

Posterior circulation:

The intracranial vertebral arteries are patent. The basilar artery
is patent. The posterior cerebral arteries are patent. The P1 right
PCA is hypoplastic and there is a sizable right posterior
communicating artery. The left posterior communicating artery is
hypoplastic or absent.

Venous sinuses: Within the limitations of contrast timing, no
convincing thrombus. Unremarkable appearance of the jugular bulbs
and sigmoid plates.

Anatomic variants: As described

Review of the MIP images confirms the above findings
IMPRESSION: CT head:

1. 1 cm cystic lesion within the anterior right temporal lobe with
mild surrounding edema/gliosis, unchanged from the brain MRI
examinations of 10/16/2020 and 10/18/2020. Please refer to these
prior reports for further description. A follow-up contrast-enhanced
brain MRI 6 months from the prior MRI of 10/18/2020 is again
recommended to ensure stability.
2. Stable mild cerebral white matter chronic small vessel ischemic
disease.

CTA neck:

1. The bilateral common carotid, internal carotid and vertebral
arteries are patent within the neck without stenosis or significant
atherosclerotic disease.
2. Rightward deviation of the subglottic trachea secondary to
innominate artery tortuosity and a thoracic dextrocurvature.

CTA head:

1. No intracranial large vessel occlusion or proximal high-grade
arterial stenosis.
2. Intracranial aneurysm is identified.
3. No cause for pulsatile tinnitus is identified.

## 2021-09-26 ENCOUNTER — Other Ambulatory Visit: Payer: Self-pay | Admitting: Internal Medicine

## 2021-09-26 DIAGNOSIS — R221 Localized swelling, mass and lump, neck: Secondary | ICD-10-CM

## 2021-10-04 ENCOUNTER — Ambulatory Visit
Admission: RE | Admit: 2021-10-04 | Discharge: 2021-10-04 | Disposition: A | Discharge status: Home / Self Care | Payer: Medicare Other | Source: Ambulatory Visit | Attending: Internal Medicine | Admitting: Internal Medicine

## 2021-10-04 ENCOUNTER — Other Ambulatory Visit: Payer: Self-pay

## 2021-10-04 DIAGNOSIS — R221 Localized swelling, mass and lump, neck: Secondary | ICD-10-CM | POA: Insufficient documentation

## 2022-07-22 ENCOUNTER — Other Ambulatory Visit: Payer: Self-pay | Admitting: Internal Medicine

## 2022-07-22 DIAGNOSIS — Z1231 Encounter for screening mammogram for malignant neoplasm of breast: Secondary | ICD-10-CM

## 2022-07-24 ENCOUNTER — Ambulatory Visit
Admission: RE | Admit: 2022-07-24 | Discharge: 2022-07-24 | Disposition: A | Discharge status: Home / Self Care | Payer: Medicare Other | Source: Ambulatory Visit | Attending: Internal Medicine | Admitting: Internal Medicine

## 2022-07-24 DIAGNOSIS — Z1231 Encounter for screening mammogram for malignant neoplasm of breast: Secondary | ICD-10-CM | POA: Diagnosis present

## 2022-08-22 ENCOUNTER — Other Ambulatory Visit: Payer: Self-pay | Admitting: Internal Medicine

## 2022-08-22 DIAGNOSIS — G9389 Other specified disorders of brain: Secondary | ICD-10-CM

## 2022-08-27 ENCOUNTER — Ambulatory Visit
Admission: RE | Admit: 2022-08-27 | Discharge: 2022-08-27 | Disposition: A | Discharge status: Home / Self Care | Payer: Medicare Other | Source: Ambulatory Visit | Attending: Internal Medicine | Admitting: Internal Medicine

## 2022-08-27 DIAGNOSIS — G9389 Other specified disorders of brain: Secondary | ICD-10-CM | POA: Diagnosis not present

## 2022-08-27 MED ORDER — GADOBUTROL 1 MMOL/ML IV SOLN
7.0000 mL | Freq: Once | INTRAVENOUS | Status: AC | PRN
  Administered 2022-08-27: 7 mL via INTRAVENOUS

## 2023-07-20 ENCOUNTER — Other Ambulatory Visit: Payer: Self-pay | Admitting: Internal Medicine

## 2023-07-20 DIAGNOSIS — Z1231 Encounter for screening mammogram for malignant neoplasm of breast: Secondary | ICD-10-CM

## 2023-08-07 ENCOUNTER — Ambulatory Visit
Admission: RE | Admit: 2023-08-07 | Discharge: 2023-08-07 | Disposition: A | Discharge status: Home / Self Care | Payer: Medicare Other | Source: Ambulatory Visit | Attending: Internal Medicine | Admitting: Internal Medicine

## 2023-08-07 DIAGNOSIS — Z1231 Encounter for screening mammogram for malignant neoplasm of breast: Secondary | ICD-10-CM | POA: Insufficient documentation

## 2023-10-11 LAB — COLOGUARD: COLOGUARD: NEGATIVE

## 2024-01-07 ENCOUNTER — Other Ambulatory Visit: Payer: Self-pay | Admitting: Internal Medicine

## 2024-01-07 ENCOUNTER — Encounter: Payer: Self-pay | Admitting: Internal Medicine

## 2024-01-07 DIAGNOSIS — R413 Other amnesia: Secondary | ICD-10-CM

## 2024-01-07 DIAGNOSIS — G939 Disorder of brain, unspecified: Secondary | ICD-10-CM

## 2024-01-08 ENCOUNTER — Other Ambulatory Visit: Payer: Self-pay | Admitting: Internal Medicine

## 2024-01-08 DIAGNOSIS — R2241 Localized swelling, mass and lump, right lower limb: Secondary | ICD-10-CM

## 2024-01-10 ENCOUNTER — Ambulatory Visit
Admission: RE | Admit: 2024-01-10 | Discharge: 2024-01-10 | Disposition: A | Discharge status: Home / Self Care | Source: Ambulatory Visit | Attending: Internal Medicine | Admitting: Internal Medicine

## 2024-01-10 DIAGNOSIS — R413 Other amnesia: Secondary | ICD-10-CM | POA: Insufficient documentation

## 2024-01-10 DIAGNOSIS — G939 Disorder of brain, unspecified: Secondary | ICD-10-CM | POA: Insufficient documentation

## 2024-01-10 MED ORDER — GADOBUTROL 1 MMOL/ML IV SOLN
7.0000 mL | Freq: Once | INTRAVENOUS | Status: AC | PRN
Start: 2024-01-10 — End: 2024-01-10
  Administered 2024-01-10: 7 mL via INTRAVENOUS

## 2024-01-18 ENCOUNTER — Ambulatory Visit
Admission: RE | Admit: 2024-01-18 | Discharge: 2024-01-18 | Disposition: A | Discharge status: Home / Self Care | Source: Ambulatory Visit | Attending: Internal Medicine | Admitting: Internal Medicine

## 2024-01-18 DIAGNOSIS — R2241 Localized swelling, mass and lump, right lower limb: Secondary | ICD-10-CM | POA: Diagnosis present

## 2024-02-04 NOTE — Progress Notes (Signed)
 Today the history is gathered from: 100% - patient  0% - friend  RECORDS SUMMARY: I have reviewed the note dated 01/06/2024 from Dr. Sherial who has indicated:  Movement Disorder  Given these abnormal neurologic findings, a referral to neurology has been recommended.  REFERRING PHYSICIAN: Sherial Bail, MD PRIMARY CARE PHYSICIAN:  Sherial Bail, MD   IMPRESSION/PLAN  Desiree Elliott is a 73 y.o. female presenting for evaluation of  TREMORS/ MEMORY LOSS/ IMBALANCE/  New to me  Patient with past medical history significant for hypertension, hyperlipidemia, hypothyroidism, CKD, depression and anxiety.  She presents for evaluation and management of neuropathy, tremor, imbalance, and memory loss.  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  She reports ongoing pain and numbness in her feet bilaterally.  Patient has noticed worsening tremor in her hands bilaterally particularly making eating difficult.  She had recent MRI done to monitor cystic lesion in the anterior right temporal lobe which remained stable.  Patient does note sleep difficulty, waking frequently during the night.  Patient also notes both visual and auditory hallucinations at night, which she states are not distressing. Memory evaluation today (02/04/2024): 28/30 Reviewed and discussed results of MRI brain, details below. Reviewed and discussed results of previous EMG, details below. Discussed at length management options including gabapentin for both tremors and neuropathy as well as trazodone for sleep aid.  Patient opts to hold off on additional medications at this time. Referral to psychiatry for anxiety, depression, and hallucinations May continue melatonin for sleep  May take Magnesium glycinate 600 mg nightly for sleep Repeat memory evaluation in 6 to 12 months Presentation consistent with mild cognitive impairment; suspect poor  sleep and uncontrolled anxiety be contributing  Medications previously tried:  Follow-up with Sherran Berliner, PA-C in 6 months, sooner if needed.   p=   CHIEF COMPLAINT & HPI  Desiree Elliott is a 73 y.o. female presenting for evaluation of: Chief Complaint  Patient presents with   Tremors   Memory Loss   Unstable Gait    TREMORS/ MEMORY LOSS/ IMBALANCE/  Patient with gradual onset of tremors for several years, worsening for one month. Tremors occurring with movement in bilateral hands. Infrequent tremors around mouth. No change in speech. Difficulty handwriting, holding utensils and grip strength. No difficulty with ADLs. Denies tremors in lower extremities. No neuropathy in upper extremities. Worsening imbalance. Reports sharp pain and numbness at top of bilateral feet radiating to ankles. No recent falls, near falls. Denies use of assistive device. Reports gradual memory change for one year. Difficulty recalling names, misplacing items, repeating questions and conversations, word finding difficulty. No difficulty recalling familiar locations, using complex tools and devices. Not currently driving. Denies FHx of dementia. No safety concerns, is living with friend at her home. Poor sleep at night, endorses at night visual hallucinations of transparent people who will occasionally speak to her. Auditory hallucinations of hearing music at night. Hallucinations are not distressing. Denies hearing loss or wearing bilateral hearing aids. Notes anxiousness at baseline, no depression. Memory evaluation today is 28/30.  DATA SUMMARY: 01/10/2024 MR BRAIN W WO CONTRAST IMPRESSION:  Stable 1 cm benign-appearing cystic lesion in the anterior right temporal lobe.  Age-related change.   08/27/2022 MR BRAIN W WO CONTRAST IMPRESSION:  1. Unchanged nonenhancing cystic lesion in the right anterior  temporal lobe with mild surrounding FLAIR signal abnormality favored  to reflect an anterior temporal  lobe perivascular space.  2. Otherwise, essentially normal for age brain MRI with no acute  intracranial pathology.   04/01/2021 EMG LOWERS IMPRESSION: This is an abnormal electrodiagnostic study consistent with a generalized sensory polyneuropathy  03/27/2021 EMG UPPERS IMPRESSION: This is an abnormal electrodiagnostic exam consistent with bilateral mild (grade II) carpal tunnel syndrome (median nerve entrapment at wrist).   11/13/2020 CTA HEAD NECK W WO CONTRAST IMPRESSION:  CT head:  1. 1 cm cystic lesion within the anterior right temporal lobe with  mild surrounding edema/gliosis, unchanged from the brain MRI  examinations of 10/16/2020 and 10/18/2020. Please refer to these  prior reports for further description. A follow-up contrast-enhanced  brain MRI 6 months from the prior MRI of 10/18/2020 is again  recommended to ensure stability.  2. Stable mild cerebral white matter chronic small vessel ischemic  disease.   CTA neck:  1. The bilateral common carotid, internal carotid and vertebral  arteries are patent within the neck without stenosis or significant  atherosclerotic disease.  2. Rightward deviation of the subglottic trachea secondary to  innominate artery tortuosity and a thoracic dextrocurvature.   CTA head:  1. No intracranial large vessel occlusion or proximal high-grade  arterial stenosis.  2. 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  3. No cause for pulsatile tinnitus is identified.   10/18/2020 MR BRAIN AND CERVICAL SPINE W CONTRAST IMPRESSION:  MRI head:  Approximately 1 cm cystic lesion within the right anterior temporal  lobe seen on recent MRI head does not enhance. Given characteristic  location and absence of enhancement, this most likely represents an  anterior temporal lobe dilated perivascular space (which can  demonstrate surrounding edema/gliosis). Recommend follow-up MRI with  contrast in approximately 6 months to ensure stability.   MRI  cervical spine:  1. Moderate left foraminal stenosis at C6-C7. Mild left foraminal  stenosis at C3-C4 and C5-C6.  2. No significant canal stenosis.   10/16/2020 MR BRAIN WO CONTRAST IMPRESSION:  Approximately 1 cm cystic lesion of the right temporal lobe with  mild adjacent edema or gliosis. Recommend postcontrast imaging for  further evaluation.  No evidence of recent infarction or hemorrhage. Mild chronic  microvascular ischemic changes.   VISIT SUMMARIES:   MEDICATIONS Current Outpatient Medications  Medication Sig Dispense Refill   aspirin 81 MG EC tablet Take 81 mg by mouth once daily     buPROPion (WELLBUTRIN XL) 300 MG XL tablet Take 1 tablet by mouth once daily 90 tablet 0   DULoxetine (CYMBALTA) 30 MG DR capsule Take 1 capsule by mouth once daily 90 capsule 0   DULoxetine (CYMBALTA) 60 MG DR capsule Take 1 capsule by mouth once daily 90 capsule 0   levothyroxine (SYNTHROID) 100 MCG tablet Take 1 tablet (100 mcg total) by mouth every morning before breakfast (0630) ON AN EMPTY STOMACH WITH A GLASS OF WATER AT LEAST 30-60 MINUTES BEFORE BREAKFAST 90 tablet 1   lisinopriL (ZESTRIL) 5 MG tablet Take 1 tablet by mouth once daily 90 tablet 0   magnesium 250 mg Tab Take by mouth     selenium 200 mcg tablet Take by mouth once daily     simvastatin (ZOCOR) 40 MG tablet Take 1 tablet by mouth nightly 90 tablet 0   ZINC ORAL Take 50 mg by mouth once daily     No current facility-administered medications for this visit.    ALLERGIES No Known Allergies   EXAM   Vitals:   02/04/24 1317  BP: 130/80  Weight: 70.3 kg (155 lb)  Height: 152.4 cm (5')  PainSc: 0-No pain   Body mass index is 30.27 kg/m.  MEMORY EVALUATION: 02/04/2024 - 28/30  GENERAL: Very pleasant female.  NAD.  Normocephalic and atraumatic.  MUSCULOSKELETAL: Bulk - Normal Tone - Normal Pronator Drift - Absent bilaterally. Ambulation - Gait and station is steady Romberg - deferred Moderate  bilateral action tremor of the upper extremities  R/L 5/5    Shoulder abduction (deltoid/supraspinatus, axillary/suprascapular n, C5) 5/5    Elbow flexion (biceps brachii, musculoskeletal n, C5-6) 5/5    Elbow extension (triceps, radial n, C7) 5/5    Finger adduction (interossei, ulnar n, T1)  5/5    Hip flexion (iliopsoas, L1/L2) 5/5    Knee flexion (hamstrings, sciatic n, L5/S1)  5/5    Knee extension (quadriceps, femoral n, L3/4) 5/5    Ankle dorsiflexion (tibialis anterior, deep fibular n, L4/5) 5/5    Ankle plantarflexion (gastroc, tibial n, S1)   NEUROLOGICAL: MENTAL STATUS: Patient is oriented to person, place and time.   Short-term memory is intact Long-term memory is intact.   Attention span and concentration are intact.   Naming and repetition are intact. Comprehension is intact.   Expressive speech is intact.   Patient's fund of knowledge is within normal limits for educational level.  CRANIAL NERVES: Visual acuity and visual fields are intact         Extraocular muscles are intact                        Facial sensation is intact bilaterally                Facial strength is intact bilaterally                   Hearing is intact bilaterally                              Palate elevates midline, normal phonation     Shoulder shrug strength is intact                    Tongue protrudes midline                       COORDINATION/CEREBELLAR: Finger to nose testing is WNL      PAST MEDICAL HISTORY Past Medical History:  Diagnosis Date   Anxiety have always had it   Arthritis    Recently   Chronic kidney disease 2012   Depression 2005   Essential tremor    GERD (gastroesophageal reflux disease)    Hyperlipidemia 2010   Hypertension 2010   Hypothyroidism 2015   Osteoporosis 2010   Pulsatile tinnitus    Thyroid disease 2015   hypothyroidism   Tremor     PAST SURGICAL HISTORY Past Surgical History:  Procedure Laterality Date   HYSTERECTOMY   2010    FAMILY HISTORY Family History  Problem Relation Name Age of Onset   Cancer Mother Winifred    Brain cancer Mother Winifred    Breast cancer Mother Winifred        At age 69   Hyperlipidemia (Elevated cholesterol) Father Lynwood Botts    Myocardial Infarction (Heart attack) Father Lynwood Botts    Coronary Artery Disease (Blocked arteries around heart) Father Lynwood Botts    High blood pressure (Hypertension) Father Lynwood Botts    Multiple sclerosis Sister  No Known Problems Brother     High blood pressure (Hypertension) Son     Hyperlipidemia (Elevated cholesterol) Son      SOCIAL HISTORY  Social History   Tobacco Use   Smoking status: Never   Smokeless tobacco: Never  Vaping Use   Vaping status: Never Used  Substance Use Topics   Alcohol use: Never   Drug use: Never     REVIEW OF SYSTEMS:  13 system ROS was verbally reviewed with patient. Pertinent positives and negatives are mentioned above in the HPI and all other systems are negative.  DATA  I have personally reviewed all of the data outlined below both prior to the appointment and during the appointment with the patient as appropriate.  Appointment on 12/30/2023  Component Date Value Ref Range Status   Thyroid Stimulating Hormone (TSH) 12/30/2023 0.062 (L)  0.450-5.330 uIU/ml uIU/mL Final   Thyroxine, Free (FT4) 12/30/2023 1.01  0.66 - 1.14 ng/dL Final   Reverse T3 - [facility] 12/30/2023 23.2  9.2 - 24.1 ng/dL Final   Triiodothyronine (T3), Total 12/30/2023 103  87 - 178 ng/dL Final   T3 Uptake Ratio - [facility] 12/30/2023 26  24 - 39 % Final   Glucose 12/30/2023 88  70 - 110 mg/dL Final   Sodium 94/71/7974 140  136 - 145 mmol/L Final   Potassium 12/30/2023 4.6  3.6 - 5.1 mmol/L Final   Chloride 12/30/2023 102  97 - 109 mmol/L Final   Carbon Dioxide (CO2) 12/30/2023 32.5 (H)  22.0 - 32.0 mmol/L Final   Calcium 12/30/2023 9.3  8.7 - 10.3 mg/dL Final   Urea Nitrogen  (BUN) 12/30/2023 25  7 - 25 mg/dL Final   Creatinine 94/71/7974 1.1  0.6 - 1.1 mg/dL Final   Glomerular Filtration Rate (eGFR) 12/30/2023 52 (L)  >60 mL/min/1.73sq m Final   BUN/Crea Ratio 12/30/2023 22.7 (H)  6.0 - 20.0 Final   Anion Gap w/K 12/30/2023 10.1  6.0 - 16.0 Final   Color 12/30/2023 Colorless  Colorless, Straw, Light Yellow, Yellow, Dark Yellow Final   Clarity 12/30/2023 Clear  Clear Final   Specific Gravity 12/30/2023 1.005  1.005 - 1.030 Final   pH, Urine 12/30/2023 6.5  5.0 - 8.0 Final   Protein, Urinalysis 12/30/2023 Negative  Negative mg/dL Final   Glucose, Urinalysis 12/30/2023 Negative  Negative mg/dL Final   Ketones, Urinalysis 12/30/2023 Negative  Negative mg/dL Final   Blood, Urinalysis 12/30/2023 Negative  Negative Final   Nitrite, Urinalysis 12/30/2023 Negative  Negative Final   Leukocyte Esterase, Urinalysis 12/30/2023 Negative  Negative Final   Bilirubin, Urinalysis 12/30/2023 Negative  Negative Final   Urobilinogen, Urinalysis 12/30/2023 0.2  0.2 - 1.0 mg/dL Final   WBC, UA 94/71/7974 1  <=5 /hpf Final   Red Blood Cells, Urinalysis 12/30/2023 <1  <=3 /hpf Final   Bacteria, Urinalysis 12/30/2023 0-5  0 - 5 /hpf Final   Squamous Epithelial Cells, Urinaly* 12/30/2023 0  /hpf Final   Creatinine, Random Urine 12/30/2023 28.0 (L)  37.0 - 250.0 mg/dL Final   Urine Albumin, Random 12/30/2023 <7    mg/L Final   Urine Albumin/Creatinine Ratio 12/30/2023 <25.0  <30.0 ug/mg Final  Office Visit on 09/28/2023  Component Date Value Ref Range Status   Cologuard 10/02/2023 Negative   Final  Appointment on 09/21/2023  Component Date Value Ref Range Status   Thyroid Stimulating Hormone (TSH) 09/21/2023 0.527  0.450-5.330 uIU/ml uIU/mL Final   Thyroxine, Free (FT4) 09/21/2023 1.03  0.66 - 1.14 ng/dL  Final   C Reactive Protein - [facility] 09/21/2023 <1  0 - 10 mg/L Final   Sedimentation Rate-Automated 09/21/2023 12  0 - 30 mm/hr Final   Uric Acid  09/21/2023 4.4  2.3 - 6.6 mg/dL Final   ANA Direct - [facility] 09/21/2023 Negative  Negative Final   Reverse T3 - [facility] 09/21/2023 22.6  9.2 - 24.1 ng/dL Final   Glucose 97/82/7974 87  70 - 110 mg/dL Final   Sodium 97/82/7974 140  136 - 145 mmol/L Final   Potassium 09/21/2023 4.2  3.6 - 5.1 mmol/L Final   Chloride 09/21/2023 102  97 - 109 mmol/L Final   Carbon Dioxide (CO2) 09/21/2023 32.2 (H)  22.0 - 32.0 mmol/L Final   Urea Nitrogen (BUN) 09/21/2023 26 (H)  7 - 25 mg/dL Final   Creatinine 97/82/7974 1.1  0.6 - 1.1 mg/dL Final   Glomerular Filtration Rate (eGFR) 09/21/2023 52 (L)  >60 mL/min/1.73sq m Final   Calcium 09/21/2023 9.4  8.7 - 10.3 mg/dL Final   AST  97/82/7974 14  8 - 39 U/L Final   ALT  09/21/2023 8  5 - 38 U/L Final   Alk Phos (alkaline Phosphatase) 09/21/2023 51  34 - 104 U/L Final   Albumin 09/21/2023 4.3  3.5 - 4.8 g/dL Final   Bilirubin, Total 09/21/2023 0.4  0.3 - 1.2 mg/dL Final   Protein, Total 09/21/2023 6.4  6.1 - 7.9 g/dL Final   A/G Ratio 97/82/7974 2.0  1.0 - 5.0 gm/dL Final     No follow-ups on file.  Payor: MEDICARE / Plan: MEDICARE A AND B / Product Type: Medicare /   This note is partially written by Greig Pouch, scribe, in the presence of and acting as the scribe of [employer], PA-C.   Attestation Statement:   I personally performed the service, non-incident to. (WP)   CELESTE CRAFT CANTWELL, PA

## 2024-06-23 NOTE — Progress Notes (Signed)
 [facility] Rheumatology   HPI  Reason for Consult: Joint Pains  Chief Complaint  Patient presents with   Joint Pain   Muscle Pain   I have been asked to see this patient in consultation by Dr.Kalisetti. I have reviewed the medical records from Dr.Kalisetti. Desiree Elliott is a 73 y.o. female is here today for evaluation of joint pains who has medical history of thyroid disease, hypertension and hyperlipidemia. Her symptoms started in Feb of 2025. She developed thigh pains. She then developed posterior aspect of knee with swelling. She denies any heat or redness of the knees. She has trouble with walking due to pains. She does complain of lower back pain. She denies any paresthesia of the legs from the hip. She has control of bowel and bladder. She has history of neuropathy of the feet. The cause is unknown. She is followed by neurology, she is not on any medication for this. She is able to form a fist. She has no pain or swelling of the hand joints. She does have history of elbow swelling that has resolved. She does get pain of the [facility] and DIP joint, wrist and shoulders. She states that she has gained weight about 6 lbs over the past year. She also complains of dry eyes, dry mouth, dry nose and dry skin. She has trouble swallowing certain foods. She has noticed occasional lymph node enlargement. She states that in the sun she gets fatigue and there is redness associated with this. She denies any blisters. She does get occasional dry cough. She is able to get up from seated position. She takes Tylenol for pain as needed, this does help.   There is no family history of rheumatoid arthritis or lupus. She has no history of gout or psoriasis or inflammatory disease. She denies any infection or change in medication or vaccines prior to the start of therapy.   Patient denies fever, weight loss, night sweats, alopecia, iritis/uveitis/scleritis, parotid swelling, malar rash, nasal/oral ulcers,  hearing loss, dyspnea, hemoptysis/epistaxis/recurrent sinus infection, pleurisy, serositis, hematochezia/hematuria, seizures/stroke/DVT/PE/Miscarriages/Raynaud's. ______________________________________________________________________    Current Outpatient Medications:    aspirin 81 MG EC tablet, Take 81 mg by mouth once daily, Disp: , Rfl:    buPROPion (WELLBUTRIN XL) 300 MG XL tablet, Take 1 tablet by mouth once daily, Disp: 90 tablet, Rfl: 0   calcium carbonate (TUMS E-X) 300 mg (750 mg) chewable tablet, Take 300 mg of elemental by mouth every 2 (two) hours as needed for Heartburn, Disp: , Rfl:    cholecalciferol 1000 unit tablet, Take 600 Units by mouth, Disp: , Rfl:    DULoxetine (CYMBALTA) 30 MG DR capsule, Take 1 capsule by mouth once daily, Disp: 90 capsule, Rfl: 0   DULoxetine (CYMBALTA) 60 MG DR capsule, Take 1 capsule by mouth once daily, Disp: 90 capsule, Rfl: 0   levothyroxine (SYNTHROID) 100 MCG tablet, Take on an empty stomach with a glass of water at least 30-60 minutes before breakfast., Disp: 90 tablet, Rfl: 1   liothyronine (CYTOMEL) 5 MCG tablet, Take 1 tablet (5 mcg total) by mouth every morning before breakfast (0630) ON AN EMPTY STOMACH WITH A GLASS OF WATER AT LEAST 30-60 MINUTES BEFORE BREAKFAST, Disp: 30 tablet, Rfl: 2   lisinopriL (ZESTRIL) 5 MG tablet, Take 1 tablet by mouth once daily, Disp: 90 tablet, Rfl: 0   magnesium 250 mg Tab, Take by mouth, Disp: , Rfl:    phentermine-topiramate (QSYMIA) 7.5-46 mg ER capsule, Take 1 capsule by mouth every morning  for 30 days, Disp: 30 capsule, Rfl: 0   selenium 200 mcg tablet, Take by mouth once daily, Disp: , Rfl:    simvastatin (ZOCOR) 40 MG tablet, Take 1 tablet by mouth nightly, Disp: 90 tablet, Rfl: 0   ZINC ORAL, Take 50 mg by mouth once daily, Disp: , Rfl:    gabapentin (NEURONTIN) 100 MG capsule, Take 1 capsule (100 mg total) by mouth at bedtime, Disp: 30 capsule, Rfl: 5  No Known Allergies  Past  Medical History:  Diagnosis Date   Anxiety have always had it   Arthritis    Recently   Chronic kidney disease 2012   Depression 2005   Essential tremor    GERD (gastroesophageal reflux disease)    Hyperlipidemia 2010   Hypertension 2010   Hypothyroidism 2015   Osteoporosis 2010   Pulsatile tinnitus    Thyroid disease 2015   hypothyroidism   Tremor     Past Surgical History:  Procedure Laterality Date   HYSTERECTOMY  2010    Family History  Problem Relation Name Age of Onset   Cancer Mother Winifred    Brain cancer Mother Winifred    Breast cancer Mother Winifred        At age 85   Hyperlipidemia (Elevated cholesterol) Father Lynwood Botts    Myocardial Infarction (Heart attack) Father Lynwood Botts    Coronary Artery Disease (Blocked arteries around heart) Father Lynwood Botts    High blood pressure (Hypertension) Father Lynwood Botts    Multiple sclerosis Sister     No Known Problems Brother     High blood pressure (Hypertension) Son     Hyperlipidemia (Elevated cholesterol) Son      Social History   Tobacco Use   Smoking status: Never   Smokeless tobacco: Never  Substance Use Topics   Alcohol use: Never    ______________________________________________________________________  Review of Systems:  Review of Systems  Constitutional:  Positive for fatigue.  HENT:  Positive for trouble swallowing. Negative for mouth sores.        Dry Mouth  Eyes:  Positive for redness.       Dry Eyes  Respiratory:  Negative for cough and shortness of breath.   Cardiovascular:  Positive for leg swelling. Negative for chest pain.  Gastrointestinal:  Negative for constipation, diarrhea and nausea.  Endocrine: Negative for cold intolerance and heat intolerance.  Genitourinary:  Negative for hematuria.  Musculoskeletal:        Per HPI  Skin:  Positive for color change. Negative for rash.       Hives, Hair Loss  Neurological:  Positive  for weakness and headaches. Negative for dizziness and numbness.       Memory loss  Hematological:  Does not bruise/bleed easily.  Psychiatric/Behavioral:  Positive for agitation and sleep disturbance. Negative for dysphoric mood. The patient is nervous/anxious.   All other systems reviewed and are negative.   Objective:  Vitals:   06/23/24 0802  BP: 114/76  Pulse: 76  Temp: 36.6 C (97.9 F)  TempSrc: Temporal  Weight: 70.8 kg (156 lb)  Height: 152.4 cm (5')  PainSc:   8     Length of Stiffness: Whole Day  GEN - Pleasant, No Apparent Distress  HEENT - normocephalic and atraumatic. Conjunctiva Clear.  No Nasal/Oral Ulcer. Dry Mouth Neck - supple with no adenopathy or thyromegaly.   C spine with full range of motion. Heart - regular rate and rhythm, No murmurs/gallops/rub, Nml S1S2 Lungs - clear  to auscultation in all fields. Extremities - there is no cyanosis or edema. Neurological - alert and oriented.  Spine - right lower lumbar paraspinal tenderness; lumbar spine tenderness Skin - no rashes observed MSK - The following joints were examined bilaterally: Hands, Wrists, Elbows, Shoulders, Metatarsals, Ankels, Knees and Hips; they were normal apart from what is noted.   100% Fist Formation Mild DIP and PIP Enlargement, CMC Squaring Right Hip with limited internal and external rotation Both Knees with crepitus, stiffness on extension  No Synovitis or Dactylitis Strength 5/5 Proximal and Distal, Upper and Lower Extremity 10 Tender Point Gait Stiff   ______________________________________________________________________ Labs/Imaging Reviewed in EMR CMP Cr 1.1, Ast 14, ALT 08 CBC Hgb 13.3, Hct 40.4  TSH 0.37 (L) ESR 12; CRP < 1 Uric Acid 4.4 Neg: ANA Direct  Assessment and Plan    Diagnoses and all orders for this visit:  Polyarthralgia -     Angiotensin-Converting Enzyme - [facility] -     CCP Antibodies IgG/IgA - [facility] -     Rheumatoid Arthritis Factor -  [facility] -     Sjogren's Ab, Anti-SS-A/-SS-B - [facility] -     SeroNeg RAx4 Profile - [facility] -     gabapentin (NEURONTIN) 100 MG capsule; Take 1 capsule (100 mg total) by mouth at bedtime  Sicca syndrome (HHS-HCC) -     Rheumatoid Arthritis Factor - [facility] -     Sjogren's Ab, Anti-SS-A/-SS-B - [facility]  Photosensitivity of skin -     Sjogren's Ab, Anti-SS-A/-SS-B - [facility]  Weakness -     Creatine Kinase (CK), Total -     gabapentin (NEURONTIN) 100 MG capsule; Take 1 capsule (100 mg total) by mouth at bedtime  Lymph node enlargement -     IFE and PE, Serum - [facility] -     IFE and PE, Random Urine - [facility]  Chronic midline low back pain without sciatica -     X-ray lumbar spine 4 plus views; Future  Bilateral hip pain -     X-ray hip left 2 or 3 views with or without pelvis; Future -     X-ray hip right 2 or 3 views with or without pelvis; Future  Chronic pain of both knees -     X-ray knee left 3 views; Future -     X-ray knee right 3 views; Future  Myalgia -     gabapentin (NEURONTIN) 100 MG capsule; Take 1 capsule (100 mg total) by mouth at bedtime   -- She has pain of multiple joints, sicca syndrome, lymph node enlargement, sun sensitivity, paresthesia and weakness. She has no difficulty getting up from seated position. She has no synovitis on exam. She does have OA changes of the hands. The knees does have crepitus and stiffness on extension.  -- Her cause is complex as she has multiple symptoms. I have checked labs to rule out Sjogren's Syndrome. Her ANA direct is negative.  -- Start Gabapentin 100 mg at bedtime to help with her pains  -- Check xray for severity of arthritis changes and need for further evaluation    Return in about 4 weeks (around 07/21/2024) for Routine Follow Up.  All new prescription medications, changes in current prescription dosages, and sample medications were discussed with the patient, including patient education, medication name, use, dosage,  potential side effects, drug interactions, consequences of not using/taking, and special instructions.  Patient expressed understanding.  No barriers to adherence.   I appreciate the opportunity to  participate in the care of [employer]. Please do not hesitate to contact me with any questions or concerns that may arise in regards to the patient's rheumatologic disease.   Attestation Statement:   I personally performed the service. (TP)  MAYUR LOREE BLANCH, MD

## 2024-07-15 NOTE — Progress Notes (Signed)
 Chief Complaint:   Chief Complaint  Patient presents with   Follow-up    Discuss Phentermine      Subjective:   Desiree Elliott is a 73 y.o. female in today for follow-up today  History of Present Illness Desiree Elliott is a 73 year old female with hypertension and chronic pain who presents for medication dose adjustment and pain management.  She is here for a follow-up regarding her weight loss medication dose adjustment. Seems to be doing well with the Qsymia, feels like weight has come to a standstill she has lost a few pounds.  Tolerating it very well and ready for dose increase. Her blood pressure is well-controlled with her current regimen, which includes lisinopril 5 mg. No side effects such as heart flutters or nausea have been experienced.  She has been seen by a rheumatologist since her last visit for polymyalgia and polyarthralgia, and gabapentin was added to her medication regimen. She is on a low dose of gabapentin, which she takes at bedtime, and it is helping with her sleep primarily   She experiences ongoing pain in her left hip and knee, which starts in the hip and radiates down. She also has back pain, which feels different from the low back pain.  Despite the pain, she continues to walk to maintain her activity level.  She reports sleeping well, going to bed at 6 PM and waking up early in the morning, feeling rested and alert. She sleeps for eight hours and takes an additional nap later in the day.    No heart flutters or nausea. No tremors while on her thyroid medication.   Current Outpatient Medications  Medication Sig Dispense Refill   aspirin 81 MG EC tablet Take 81 mg by mouth once daily     buPROPion (WELLBUTRIN XL) 300 MG XL tablet Take 1 tablet by mouth once daily 90 tablet 0   calcium carbonate (TUMS E-X) 300 mg (750 mg) chewable tablet Take 300 mg of elemental by mouth every 2 (two) hours as needed for Heartburn     cholecalciferol 1000 unit  tablet Take 600 Units by mouth     DULoxetine (CYMBALTA) 30 MG DR capsule Take 1 capsule by mouth once daily 90 capsule 0   DULoxetine (CYMBALTA) 60 MG DR capsule Take 1 capsule by mouth once daily 90 capsule 0   gabapentin (NEURONTIN) 100 MG capsule Take 1 capsule (100 mg total) by mouth at bedtime 30 capsule 5   levothyroxine (SYNTHROID) 100 MCG tablet Take on an empty stomach with a glass of water at least 30-60 minutes before breakfast. 90 tablet 1   lisinopriL (ZESTRIL) 5 MG tablet Take 1 tablet by mouth once daily 90 tablet 0   magnesium 250 mg Tab Take by mouth     selenium 200 mcg tablet Take by mouth once daily     simvastatin (ZOCOR) 40 MG tablet Take 1 tablet by mouth nightly 90 tablet 0   ZINC ORAL Take 50 mg by mouth once daily     phentermine-topiramate 11.25-69 mg CM24 Take 1 capsule by mouth once daily 30 capsule 0   No current facility-administered medications for this visit.    Allergies as of 07/15/2024   (No Known Allergies)    Past Medical History:  Diagnosis Date   Anxiety have always had it   Arthritis    Recently   Chronic kidney disease 2012   Depression 2005   Essential tremor    GERD (gastroesophageal reflux disease)  Hyperlipidemia 2010   Hypertension 2010   Hypothyroidism 2015   Osteoporosis 2010   Pulsatile tinnitus    Thyroid disease 2015   hypothyroidism   Tremor     Past Surgical History:  Procedure Laterality Date   HYSTERECTOMY  2010     Family History  Problem Relation Name Age of Onset   Cancer Mother Winifred    Brain cancer Mother Winifred    Breast cancer Mother Winifred        At age 69   Hyperlipidemia (Elevated cholesterol) Father Lynwood Botts    Myocardial Infarction (Heart attack) Father Lynwood Botts    Coronary Artery Disease (Blocked arteries around heart) Father Lynwood Botts    High blood pressure (Hypertension) Father Lynwood Botts    Multiple sclerosis Sister     No  Known Problems Brother     High blood pressure (Hypertension) Son     Hyperlipidemia (Elevated cholesterol) Son      Social History:  reports that she has never smoked. She has never used smokeless tobacco. She reports that she does not drink alcohol and does not use drugs.  Results for orders placed or performed in visit on 06/23/24  X-ray knee right 3 views   Narrative   EXAM: X-ray knee right 3 views  INDICATION:    Chronic pain of both knees [M25.561, M25.562, G89.29 (ICD-10-CM)]   TECHNIQUE AND FINDINGS:  3 views right knee  Joint spaces are maintained.  There is no evidence of fracture,  dislocation or malalignment.  Soft tissues are unremarkable.  No erosive  changes.    Impression     No acute osseous abnormalities.  HECTOR WAYNE COOPER, MD        ROS:  General: No fever, chills or recent illness. No change in weight  HEENT: No change in vision or hearing. No pain or difficulty with swallowing Respiratory: No cough or shortness of breath CV:  No chest pain or palpitations GI:  No pain, dyspepsia or change in bowel habits GU:  No dysuria, frequency, or hesitancy MSK:  No joint pain or injury, left lateral hip pain, chronic low back pain Neurological: No headaches, changes in mental status, loss of sensation or strength  Psychological: No anxiety insomnia or depression  Objective:   Body mass index is 30.27 kg/m.  BP 130/80   Pulse 70   Ht 152.4 cm (5')   Wt 70.3 kg (155 lb)   SpO2 94%   BMI 30.27 kg/m   General: WD/WN female, in no acute distress HEENT: Pupils equal and round, EOMI. oral mucosa moist.  Oropharynx clear. Neck: supple, trachea midline; no thyromegaly Respiratory:clear to auscultation.  No dullness to percussion.  No use of accessory muscles. Cardiac:  Regular rate and rhythm without murmur, gallops, or rubs Abdominal:soft, nontender, positive bowel sounds.  No organomegaly. Musculoskeletal:  No clubbing, cyanosis or edema.   Tenderness of the left trochanteric bursae area, straight leg raising test with some discomfort in the left hamstring but not overtly positive Neuro: CN grossly intact.  No acute decrease in sensation in the upper and lower extremities bilaterally. Lymph: no cervical or supraclavicular lymphadenopathy   Assessment/Plan:   Essential hypertension  (primary encounter diagnosis) Polymyalgia (HHS-HCC) Stage 3b chronic kidney disease (CMS-HCC) Acquired hypothyroidism Greater trochanteric bursitis of left hip  Assessment & Plan   Left leg trochanteric bursitis versus IT band syndrome- -pain in the left lateral hip going further down on the lateral side - Tenderness at the trochanteric  bursa area - Exercises given. - Avoid NSAIDs given her CKD history. - Icing the area will help if not improving, will refer to orthopedics - Increase gabapentin for pain - Avoid sleeping on the affected side   2. Obesity BMI 30.2- - Management with phentermine-topiramate (Qsymia)   -. No side effects reported. - Continue phentermine-topiramate -dose has been adjusted  - Continue with diet and lifestyle changes  3. Acquired hypothyroidism - Managed with levothyroxine (Synthroid). No tremors or side effects reported. - Discontinued Cytomel due to side effects  4. Generalized anxiety and depression - Managed with duloxetine and bupropion. No new symptoms or side effects reported. - Continue duloxetine and bupropion as prescribed.  5. Stage 3b chronic kidney disease -Avoid nephrotoxins and monitor. - Continue current management and monitoring.   Follow-up as scheduled in 3 months for annual physical     Goals      Follow my doctor's care plan     Maintain health/healthy lifestyle        LAVENIA BEAVER, MD  Portions of this note were created using dictation software and may contain typographical errors.

## 2024-07-26 NOTE — Progress Notes (Signed)
 Rheumatology Follow Up Note  Chief Complaint  Patient presents with   Polyarthralgia      Subjective:HPI   Desiree Elliott is a 73 y.o. female is here today for follow up of joint pains. The patient's allergies, current medications, past family history, past medical history, past social history, past surgical history and problem list were reviewed and updated as appropriate.   She is having pain of the lateral side of the left hip. She denies any paresthesia. She is able to form a fist without any swelling. She denies any rashes. She takes Tylenol as needed for pains. She is taking gabapentin at night. This is helping the pains. She has no memory trouble.   Review of Systems:   Review of Systems  Constitutional:  Positive for fatigue.  HENT:  Positive for trouble swallowing. Negative for mouth sores.        Dry Mouth  Eyes:  Positive for redness.       Dry Eyes  Respiratory:  Negative for cough and shortness of breath.   Cardiovascular:  Positive for leg swelling. Negative for chest pain.  Gastrointestinal:  Negative for constipation, diarrhea and nausea.  Endocrine: Negative for cold intolerance and heat intolerance.  Genitourinary:  Negative for hematuria.  Musculoskeletal:        Per HPI  Skin:  Positive for color change. Negative for rash.       Hives, Hair Loss  Neurological:  Positive for weakness and headaches. Negative for dizziness and numbness.       Memory loss  Hematological:  Does not bruise/bleed easily.  Psychiatric/Behavioral:  Positive for agitation and sleep disturbance. Negative for dysphoric mood. The patient is nervous/anxious.   All other systems reviewed and are negative.     Objective:  Vitals:   07/26/24 1104  BP: (!) 136/91  Pulse: 77  Temp: 36.6 C (97.8 F)  TempSrc: Temporal  Weight: 69.4 kg (153 lb)  Height: 152.4 cm (5')  PainSc:   4     Length of Stiffness: Whole Day   GEN - Pleasant, No Apparent Distress  HEENT - normocephalic  and atraumatic. Conjunctiva Clear. Neck - supple with no adenopathy or thyromegaly.   C spine with full range of motion. Heart - regular rate and rhythm, No murmurs/gallops/rub, Nml S1S2 Lungs - clear to auscultation in all fields. Extremities - there is no cyanosis or edema. Neurological - alert and oriented.  Spine - right lower lumbar paraspinal tenderness; lumbar spine tenderness Skin - no rashes observed MSK - The following joints were examined bilaterally: Hands, Wrists, Elbows, Shoulders, Metatarsals, Ankels, Knees and Hips; they were normal apart from what is noted.    100% Fist Formation Mild DIP and PIP Enlargement, CMC Squaring Left Trochanteric Bursa Tenderness Both Knees with crepitus, stiffness on extension Left Lateral Aspect of the knee with tenderness  No Synovitis or Dactylitis 10 Tender Point Gait Stiff    ______________________________________________________________________ Labs/Imaging Reviewed in EMR CMP Cr 1.1, Ast 14, ALT 08 CBC Hgb 13.3, Hct 40.4  TSH 0.37 (L) CK 81 ESR 12; CRP < 1 Uric Acid 4.4 Low ACE level SPEP: No M Spike  UPEP: No M Spike Neg: ANA Direct, RF, SSA, SSB, AntiCCP, SeroNeg Panel   Knee Xray: Normal  Hip: Xray: Normal   Lumbar Spine Xray (06/2024): Multilevel degenerative disease changes. Findings greatest at L5-S1. There is grade 1 anterolisthesis. Facet arthropathy also demonstrated in the lower lumbar spine. No acute osseous abnormalities.   Assessment and Plan  Diagnoses and all orders for this visit:  Chronic midline low back pain without sciatica -     Ambulatory Referral to Physical Therapy  Bilateral hip pain -     Ambulatory Referral to Physical Therapy   -- She has pain of multiple joints, sicca syndrome, lymph node enlargement, sun sensitivity, paresthesia and weakness. She has no difficulty getting up from seated position. She has no synovitis on exam. She does have OA changes of the hands. The knees does have  crepitus and stiffness on extension. She also has lower back degenerative changes. Her connective tissue evaluation is negative so far.  -- Continue Gabapentin 100 mg at bedtime to help with her pains  -- Refer to [facility]    Return in about 4 months (around 11/24/2024) for Routine Follow Up.   All new prescription medications, changes in current prescription dosages, and sample medications were discussed with the patient, including patient education, medication name, use, dosage, potential side effects, drug interactions, consequences of not using/taking, and special instructions.  Patient expressed understanding.  No barriers to adherence.   I appreciate the opportunity to participate in the care of [employer]. Please do not hesitate to contact me with any questions or concerns that may arise in regards to the patient's rheumatologic disease.    Attestation Statement:   I personally performed the service. (TP)  MAYUR LOREE BLANCH, MD
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
## 2024-08-09 NOTE — Progress Notes (Signed)
 Today the history is gathered from: 100% - patient  0% - friend  RECORDS SUMMARY: I have reviewed the note dated 01/06/2024 from Dr. Sherial who has indicated:  Movement Disorder  Given these abnormal neurologic findings, a referral to neurology has been recommended.  REFERRING PHYSICIAN: Sherial Bail, MD PRIMARY CARE PHYSICIAN:  Sherial Bail, MD  IMPRESSION/PLAN  Desiree Elliott is a 74 y.o. female presenting for evaluation of  TREMORS/ MEMORY LOSS/ IMBALANCE/  Assessment & Plan Essential tremor Chronic essential tremor affecting both hands, more pronounced in the right hand. Tremor is exacerbated by pressure on the wrist and affects daily activities such as writing and eating. No signs of Parkinson's disease on examination. She is accustomed to the tremor and prefers to avoid additional medications due to polypharmacy. - Continue to monitor tremor symptoms and consider medication as needed if it becomes bothersome or embarrassing. - Could consider propranolol as needed.  Patient declines today  Polyneuropathy Chronic polyneuropathy with improvement in symptoms since starting gabapentin. No new symptoms reported. Balance issues likely related to neuropathy, with ongoing physical therapy for gait and balance. - Increase gabapentin to 200 mg nightly for sleep, neuropathy - Continue physical therapy for gait and balance. - Monitor balance and neuropathy symptoms.  Mild cognitive impairment Short-term memory issues, such as forgetting names and recent events. No significant changes in long-term memory or recognition of familiar people. Memory test score improved since last visit. - Memory score today was 30 out of 30 - Continue monitoring cognitive function. - Will schedule follow-up memory test in six months. - Consider cognitive therapy  Carpal tunnel syndrome Previous nerve testing. Symptoms are not currently bothersome unless engaging in activities like crocheting. No  wrist braces or other interventions currently in use. - Consider wrist immobilizers at night if symptoms worsen. - Monitor symptoms and consider further interventions if necessary.  Insomnia Chronic insomnia with difficulty maintaining sleep, possibly exacerbated by frequent nocturia due to kidney disease. Current regimen includes melatonin and gabapentin, which aid in initial sleep but not maintenance. Discussed potential increase in gabapentin dosage to improve sleep maintenance. - Increased gabapentin to 200 mg at night to improve sleep maintenance. - Sent new prescription for gabapentin 200 mg. - Monitor sleep patterns and adjust treatment as needed. - Keep appointment with psychiatry to discuss mood/sleep  Follow up 32mo   Recording duration: 17 minutes   Medications previously tried:  CHIEF COMPLAINT & HPI  Desiree Elliott is a 74 y.o. female presenting for evaluation of: Chief Complaint  Patient presents with   TREMORS/ MEMORY LOSS/ IMBALANCE    History of Present Illness Desiree Elliott is a 74 year old female with neuropathy, tremor, imbalance, and memory loss who presents for follow-up.  She has experienced significant improvement in neuropathy symptoms since starting gabapentin over a month ago. She takes 100 mg at night, which has alleviated the shooting pains in her feet and helps her sleep, with no adverse side effects reported.  She has a history of Raynaud's syndrome, primarily affecting her fingers, which turn white when cold. She manages this condition with her primary care provider and is not on medication for it.  Tremors have been present for several years, initially in one hand and now in both, with the right hand more affected. The tremors are most noticeable during tasks such as writing, eating, or holding objects. A weighted fork purchased by her husband is awkward to use. No exacerbation of tremors with caffeine or stress, but pressure on the  wrist worsens them.    She reports balance issues but maintains daily walking and has started physical therapy, with her first session recently. She uses a cane for mobility and notes her walking has changed, feeling more flat-footed. No dragging of feet but acknowledges some imbalance, especially when walking in the mall.  Her sleep is disrupted, with gabapentin and melatonin allowing only 2-3 hours of sleep before waking. She attributes frequent bathroom trips to her kidney disease. She maintains regular sleep habits and consumes two cups of coffee daily.  Her mood is generally stable, though she experiences irritability when tired in the afternoon. She worries about global events but not personal issues, and she is content with her family life, including a new great-granddaughter.  However, she still struggles with short-term memory, such as forgetting names and recent conversations. She has stopped driving due to vision issues and an incident with her mailbox. She manages daily tasks with increased attention.  She previously experienced auditory and visual hallucinations, which have resolved since her last visit. She has an upcoming psychiatry appointment to further address these concerns.   DATA SUMMARY: 01/10/2024 MR BRAIN W WO CONTRAST IMPRESSION:  Stable 1 cm benign-appearing cystic lesion in the anterior right temporal lobe.  Age-related change.   08/27/2022 MR BRAIN W WO CONTRAST IMPRESSION:  1. Unchanged nonenhancing cystic lesion in the right anterior  temporal lobe with mild surrounding FLAIR signal abnormality favored  to reflect an anterior temporal lobe perivascular space.  2. Otherwise, essentially normal for age brain MRI with no acute  intracranial pathology.   04/01/2021 EMG LOWERS IMPRESSION: This is an abnormal electrodiagnostic study consistent with a generalized sensory polyneuropathy  03/27/2021 EMG UPPERS IMPRESSION: This is an abnormal electrodiagnostic exam consistent with  bilateral mild (grade II) carpal tunnel syndrome (median nerve entrapment at wrist).   11/13/2020 CTA HEAD NECK W WO CONTRAST IMPRESSION:  CT head:  1. 1 cm cystic lesion within the anterior right temporal lobe with  mild surrounding edema/gliosis, unchanged from the brain MRI  examinations of 10/16/2020 and 10/18/2020. Please refer to these  prior reports for further description. A follow-up contrast-enhanced  brain MRI 6 months from the prior MRI of 10/18/2020 is again  recommended to ensure stability.  2. Stable mild cerebral white matter chronic small vessel ischemic  disease.   CTA neck:  1. The bilateral common carotid, internal carotid and vertebral  arteries are patent within the neck without stenosis or significant  atherosclerotic disease.  2. Rightward deviation of the subglottic trachea secondary to  innominate artery tortuosity and a thoracic dextrocurvature.   CTA head:  1. No intracranial large vessel occlusion or proximal high-grade  arterial stenosis.  2. 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  3. No cause for pulsatile tinnitus is identified.   10/18/2020 MR BRAIN AND CERVICAL SPINE W CONTRAST IMPRESSION:  MRI head:  Approximately 1 cm cystic lesion within the right anterior temporal  lobe seen on recent MRI head does not enhance. Given characteristic  location and absence of enhancement, this most likely represents an  anterior temporal lobe dilated perivascular space (which can  demonstrate surrounding edema/gliosis). Recommend follow-up MRI with  contrast in approximately 6 months to ensure stability.   MRI cervical spine:  1. Moderate left foraminal stenosis at C6-C7. Mild left foraminal  stenosis at C3-C4 and C5-C6.  2. No significant canal stenosis.   10/16/2020 MR BRAIN WO CONTRAST IMPRESSION:  Approximately 1 cm cystic lesion of the right temporal lobe with  mild  adjacent edema or gliosis. Recommend postcontrast imaging for  further evaluation.   No evidence of recent infarction or hemorrhage. Mild chronic  microvascular ischemic changes.   VISIT SUMMARIES:   MEDICATIONS Current Outpatient Medications  Medication Sig Dispense Refill   aspirin 81 MG EC tablet Take 81 mg by mouth once daily     buPROPion (WELLBUTRIN XL) 300 MG XL tablet Take 1 tablet by mouth once daily 90 tablet 0   calcium carbonate (TUMS E-X) 300 mg (750 mg) chewable tablet Take 300 mg of elemental by mouth every 2 (two) hours as needed for Heartburn     cholecalciferol 1000 unit tablet Take 600 Units by mouth     DULoxetine  (CYMBALTA ) 30 MG DR capsule Take 1 capsule by mouth once daily 90 capsule 0   DULoxetine  (CYMBALTA ) 60 MG DR capsule Take 1 capsule by mouth once daily 90 capsule 0   gabapentin (NEURONTIN) 100 MG capsule Take 1 capsule (100 mg total) by mouth at bedtime 30 capsule 5   levothyroxine (SYNTHROID) 100 MCG tablet Take on an empty stomach with a glass of water at least 30-60 minutes before breakfast. 90 tablet 1   lisinopriL (ZESTRIL) 5 MG tablet Take 1 tablet by mouth once daily 90 tablet 0   magnesium 250 mg Tab Take by mouth     selenium 200 mcg tablet Take by mouth once daily     simvastatin (ZOCOR) 40 MG tablet Take 1 tablet by mouth nightly 90 tablet 0   ZINC ORAL Take 50 mg by mouth once daily     phentermine-topiramate (QSYMIA) 7.5-46 mg ER capsule Take 1 capsule by mouth every morning for 30 days (Patient not taking: Reported on 08/09/2024) 30 capsule 0   No current facility-administered medications for this visit.    ALLERGIES No Known Allergies   EXAM   Vitals:   08/09/24 0843  BP: (!) 134/96  Weight: 70.8 kg (156 lb)  Height: 152.4 cm (5')  PainSc: 0-No pain    Body mass index is 30.47 kg/m.  MEMORY EVALUATION: 02/04/2024 - 28/30  GENERAL: Very pleasant female.  NAD.  Normocephalic and atraumatic.  MUSCULOSKELETAL: Bulk - Normal Tone - Normal Pronator Drift - Absent bilaterally. Ambulation - Gait  and station is steady Romberg - deferred Moderate bilateral action tremor of the upper extremities  R/L 5/5    Shoulder abduction (deltoid/supraspinatus, axillary/suprascapular n, C5) 5/5    Elbow flexion (biceps brachii, musculoskeletal n, C5-6) 5/5    Elbow extension (triceps, radial n, C7) 5/5    Finger adduction (interossei, ulnar n, T1)  5/5    Hip flexion (iliopsoas, L1/L2) 5/5    Knee flexion (hamstrings, sciatic n, L5/S1)  5/5    Knee extension (quadriceps, femoral n, L3/4) 5/5    Ankle dorsiflexion (tibialis anterior, deep fibular n, L4/5) 5/5    Ankle plantarflexion (gastroc, tibial n, S1)   NEUROLOGICAL: MENTAL STATUS: Patient is oriented to person, place and time.   Short-term memory is intact Long-term memory is intact.   Attention span and concentration are intact.   Naming and repetition are intact. Comprehension is intact.   Expressive speech is intact.   Patient's fund of knowledge is within normal limits for educational level.  CRANIAL NERVES: Visual acuity and visual fields are intact         Extraocular muscles are intact  Facial sensation is intact bilaterally                Facial strength is intact bilaterally                   Hearing is intact bilaterally                              Palate elevates midline, normal phonation     Shoulder shrug strength is intact                    Tongue protrudes midline                       COORDINATION/CEREBELLAR: Finger to nose testing is WNL      PAST MEDICAL HISTORY Past Medical History:  Diagnosis Date   Anxiety have always had it   Arthritis    Recently   Chronic kidney disease 2012   Depression 2005   Essential tremor    GERD (gastroesophageal reflux disease)    Hyperlipidemia 2010   Hypertension 2010   Hypothyroidism 2015   Osteoporosis 2010   Pulsatile tinnitus    Thyroid disease 2015   hypothyroidism   Tremor     PAST SURGICAL HISTORY Past Surgical  History:  Procedure Laterality Date   HYSTERECTOMY  2010    FAMILY HISTORY Family History  Problem Relation Name Age of Onset   Cancer Mother Winifred    Brain cancer Mother Winifred    Breast cancer Mother Winifred        At age 75   Hyperlipidemia (Elevated cholesterol) Father Lynwood Botts    Myocardial Infarction (Heart attack) Father Lynwood Botts    Coronary Artery Disease (Blocked arteries around heart) Father Lynwood Botts    High blood pressure (Hypertension) Father Lynwood Botts    Multiple sclerosis Sister     No Known Problems Brother     High blood pressure (Hypertension) Son     Hyperlipidemia (Elevated cholesterol) Son      SOCIAL HISTORY  Social History   Tobacco Use   Smoking status: Never   Smokeless tobacco: Never  Vaping Use   Vaping status: Never Used  Substance Use Topics   Alcohol use: Never   Drug use: Never     REVIEW OF SYSTEMS:  13 system ROS was verbally reviewed with patient. Pertinent positives and negatives are mentioned above in the HPI and all other systems are negative.  DATA  I have personally reviewed all of the data outlined below both prior to the appointment and during the appointment with the patient as appropriate.  Office Visit on 06/23/2024  Component Date Value Ref Range Status   Angio Convert Enzyme - [facility] 06/23/2024 9 (L)  14 - 82 U/L Final   CCP Antibodies IgG/IgA - [facility] 06/23/2024 9  0 - 19 units Final   RA Latex Turbid. - [facility] 06/23/2024 <10.0  <14.0 IU/mL Final   Sjogren's Anti-SS-A - [facility] 06/23/2024 <0.2  0.0 - 0.9 AI Final   Sjogren's Anti-SS-B - [facility] 06/23/2024 <0.2  0.0 - 0.9 AI Final   Anti-MCV antibody - [facility] 06/23/2024 <20  <20 U/mL Final   Anti-CEP-1 Ab, IgG (RDL) - [facility] 06/23/2024 <79  <20 Units Final   Anti-Sa Ab, IgG (RDL) - [facility] 06/23/2024 <79  <20 Units Final   Anti-CarP Ab - [facility] 06/23/2024 <20  <20 Units Final  CK, Total (Creatine Kinase,  Total) 06/23/2024 81  38 - 234 U/L Final   Total IgG - [facility] 06/23/2024 808  586 - 1602 mg/dL Final   Immunoglobulin A, Qn, Serum - [facility] 06/23/2024 150  64 - 422 mg/dL Final   IgM - [facility] 88/79/7974 142  26 - 217 mg/dL Final   Protein Total - [facility] 06/23/2024 6.4  6.0 - 8.5 g/dL Final   Albumin - [facility] 06/23/2024 3.6  2.9 - 4.4 g/dL Final   Joeyj-8-Honalopw - [facility] 06/23/2024 0.3  0.0 - 0.4 g/dL Final   Joeyj-7-Honalopw - [facility] 06/23/2024 0.8  0.4 - 1.0 g/dL Final   Beta Globulin - [facility] 06/23/2024 0.9  0.7 - 1.3 g/dL Final   Gamma Globulin - [facility] 06/23/2024 0.8  0.4 - 1.8 g/dL Final   M-Spike - [facility] 06/23/2024 Not Observed  Not Observed g/dL Final   Globulin, Total - [facility] 06/23/2024 2.8  2.2 - 3.9 g/dL Final   A/G Ratio - [facility] 06/23/2024 1.3  0.7 - 1.7 Final   IFE 1 - [facility] 06/23/2024 Comment   Final   Please note: - [facility] 06/23/2024 Comment   Final   Protein, Ur - [facility] 06/23/2024 5.9  Not Estab. mg/dL Final   Albumin, U - [facility] 06/23/2024 100.0  % Final   Alpha-1-Globulin, U - [facility] 06/23/2024 0.0  % Final   Alpha-2-Globulin, U - [facility] 06/23/2024 0.0  % Final   Beta Globulin, U - [facility] 06/23/2024 0.0  % Final   Gamma Globulin, U - [facility] 06/23/2024 0.0  % Final   M-Spike, % - [facility] 06/23/2024 Not Observed  Not Observed % Final   IFE 1, Ur - [facility] 06/23/2024 Comment   Final   Note: - [facility] 06/23/2024 Comment   Final  Office Visit on 04/18/2024  Component Date Value Ref Range Status   Thyroid Stimulating Hormone (TSH) 04/18/2024 0.370 (L)  0.450-5.330 uIU/ml uIU/mL Final   Thyroxine, Free (FT4) 04/18/2024 0.89  0.66 - 1.14 ng/dL Final   Triiodothyronine (T3), Total 04/18/2024 116  87 - 178 ng/dL Final     No follow-ups on file.  Payor: MEDICARE / Plan: MEDICARE A AND B / Product Type: Medicare /   This note is partially written by Greig Pouch, scribe, in the presence of and acting as the scribe of Lauraine Rocks, PA-C.    I agree that the scribe documentation is complete and accurate.  This note was generated in part with voice recognition software and I apologize for any typographical errors that were not detected and corrected.     Attestation Statement:   I personally performed the service, non-incident to. [facility])   SARAH ELIZABETH MASON, PA       This note has been created using automated tools and reviewed for accuracy by [facility].

## 2024-08-11 ENCOUNTER — Encounter: Payer: Self-pay | Admitting: Psychiatry

## 2024-08-11 ENCOUNTER — Ambulatory Visit (INDEPENDENT_AMBULATORY_CARE_PROVIDER_SITE_OTHER): Admitting: Psychiatry

## 2024-08-11 ENCOUNTER — Other Ambulatory Visit: Payer: Self-pay

## 2024-08-11 VITALS — BP 128/72 | HR 80 | Temp 97.6°F | Ht 60.0 in | Wt 155.0 lb

## 2024-08-11 DIAGNOSIS — F330 Major depressive disorder, recurrent, mild: Secondary | ICD-10-CM | POA: Diagnosis not present

## 2024-08-11 DIAGNOSIS — F411 Generalized anxiety disorder: Secondary | ICD-10-CM

## 2024-08-11 MED ORDER — DULOXETINE HCL 60 MG PO CPEP: 60.0000 mg | ORAL_CAPSULE | Freq: Every day | ORAL | 0 refills | Status: AC

## 2024-08-11 NOTE — Patient Instructions (Signed)
 Change: take bupropion 300 mg in the morning  After a week of taking bupropion in the morning, take duloxetine  30 mg at night, then 60 mg in the morning, then 90 mg in the morning  Next appointment: 2/17 at 10:30

## 2024-09-20 ENCOUNTER — Ambulatory Visit: Admitting: Psychiatry
